# Patient Record
Sex: Female | Born: 1970
Health system: Southern US, Community
[De-identification: ages and names within clinical notes are randomized; demographics above are authoritative.]

## PROBLEM LIST (undated history)

## (undated) ENCOUNTER — Inpatient Hospital Stay (HOSPITAL_COMMUNITY): Payer: Self-pay

## (undated) DIAGNOSIS — F419 Anxiety disorder, unspecified: Secondary | ICD-10-CM

## (undated) DIAGNOSIS — F32A Depression, unspecified: Secondary | ICD-10-CM

## (undated) DIAGNOSIS — K760 Fatty (change of) liver, not elsewhere classified: Secondary | ICD-10-CM

## (undated) DIAGNOSIS — F329 Major depressive disorder, single episode, unspecified: Secondary | ICD-10-CM

## (undated) DIAGNOSIS — K529 Noninfective gastroenteritis and colitis, unspecified: Secondary | ICD-10-CM

## (undated) DIAGNOSIS — K8020 Calculus of gallbladder without cholecystitis without obstruction: Secondary | ICD-10-CM

## (undated) DIAGNOSIS — K589 Irritable bowel syndrome without diarrhea: Secondary | ICD-10-CM

## (undated) DIAGNOSIS — K5792 Diverticulitis of intestine, part unspecified, without perforation or abscess without bleeding: Secondary | ICD-10-CM

## (undated) DIAGNOSIS — K2970 Gastritis, unspecified, without bleeding: Secondary | ICD-10-CM

## (undated) DIAGNOSIS — K5790 Diverticulosis of intestine, part unspecified, without perforation or abscess without bleeding: Secondary | ICD-10-CM

## (undated) HISTORY — DX: Diverticulitis of intestine, part unspecified, without perforation or abscess without bleeding: K57.92

## (undated) HISTORY — PX: COLONOSCOPY: SHX174

## (undated) HISTORY — PX: NO PAST SURGERIES: SHX2092

## (undated) HISTORY — DX: Calculus of gallbladder without cholecystitis without obstruction: K80.20

## (undated) HISTORY — DX: Gastritis, unspecified, without bleeding: K29.70

## (undated) HISTORY — DX: Fatty (change of) liver, not elsewhere classified: K76.0

## (undated) HISTORY — DX: Irritable bowel syndrome, unspecified: K58.9

## (undated) HISTORY — DX: Diverticulosis of intestine, part unspecified, without perforation or abscess without bleeding: K57.90

---

## 2013-06-18 ENCOUNTER — Encounter (HOSPITAL_COMMUNITY): Payer: Self-pay | Admitting: Emergency Medicine

## 2013-06-18 ENCOUNTER — Emergency Department (HOSPITAL_COMMUNITY)
Admission: EM | Admit: 2013-06-18 | Discharge: 2013-06-18 | Disposition: A | Discharge status: Home / Self Care | Payer: Self-pay | Attending: Emergency Medicine | Admitting: Emergency Medicine

## 2013-06-18 ENCOUNTER — Emergency Department (HOSPITAL_COMMUNITY): Payer: Self-pay

## 2013-06-18 DIAGNOSIS — Z3202 Encounter for pregnancy test, result negative: Secondary | ICD-10-CM | POA: Insufficient documentation

## 2013-06-18 DIAGNOSIS — Z79899 Other long term (current) drug therapy: Secondary | ICD-10-CM | POA: Insufficient documentation

## 2013-06-18 DIAGNOSIS — K5289 Other specified noninfective gastroenteritis and colitis: Secondary | ICD-10-CM | POA: Insufficient documentation

## 2013-06-18 DIAGNOSIS — K529 Noninfective gastroenteritis and colitis, unspecified: Secondary | ICD-10-CM

## 2013-06-18 DIAGNOSIS — F411 Generalized anxiety disorder: Secondary | ICD-10-CM | POA: Insufficient documentation

## 2013-06-18 DIAGNOSIS — F3289 Other specified depressive episodes: Secondary | ICD-10-CM | POA: Insufficient documentation

## 2013-06-18 DIAGNOSIS — F329 Major depressive disorder, single episode, unspecified: Secondary | ICD-10-CM | POA: Insufficient documentation

## 2013-06-18 HISTORY — DX: Depression, unspecified: F32.A

## 2013-06-18 HISTORY — DX: Major depressive disorder, single episode, unspecified: F32.9

## 2013-06-18 HISTORY — DX: Anxiety disorder, unspecified: F41.9

## 2013-06-18 LAB — CBC WITH DIFFERENTIAL/PLATELET
Basophils Absolute: 0 10*3/uL (ref 0.0–0.1)
Basophils Relative: 0 % (ref 0–1)
Eosinophils Relative: 1 % (ref 0–5)
HCT: 39.8 % (ref 36.0–46.0)
Lymphocytes Relative: 40 % (ref 12–46)
MCHC: 35.2 g/dL (ref 30.0–36.0)
MCV: 86.7 fL (ref 78.0–100.0)
Monocytes Absolute: 0.8 10*3/uL (ref 0.1–1.0)
Neutro Abs: 4.1 10*3/uL (ref 1.7–7.7)
Platelets: 231 10*3/uL (ref 150–400)
RBC: 4.59 MIL/uL (ref 3.87–5.11)
RDW: 13.4 % (ref 11.5–15.5)
WBC: 8.3 10*3/uL (ref 4.0–10.5)

## 2013-06-18 LAB — URINALYSIS, ROUTINE W REFLEX MICROSCOPIC
Glucose, UA: NEGATIVE mg/dL
Ketones, ur: NEGATIVE mg/dL
Leukocytes, UA: NEGATIVE
Nitrite: NEGATIVE
Protein, ur: NEGATIVE mg/dL
Specific Gravity, Urine: 1.015 (ref 1.005–1.030)
Urobilinogen, UA: 0.2 mg/dL (ref 0.0–1.0)
pH: 6 (ref 5.0–8.0)

## 2013-06-18 LAB — POCT PREGNANCY, URINE: Preg Test, Ur: NEGATIVE

## 2013-06-18 LAB — COMPREHENSIVE METABOLIC PANEL
ALT: 26 U/L (ref 0–35)
AST: 23 U/L (ref 0–37)
Albumin: 4.4 g/dL (ref 3.5–5.2)
Calcium: 9.4 mg/dL (ref 8.4–10.5)
Creatinine, Ser: 0.65 mg/dL (ref 0.50–1.10)
Sodium: 139 mEq/L (ref 135–145)

## 2013-06-18 LAB — POCT I-STAT, CHEM 8
Calcium, Ion: 1.16 mmol/L (ref 1.12–1.23)
Chloride: 104 mEq/L (ref 96–112)
HCT: 43 % (ref 36.0–46.0)
Hemoglobin: 14.6 g/dL (ref 12.0–15.0)
Sodium: 142 mEq/L (ref 135–145)
TCO2: 21 mmol/L (ref 0–100)

## 2013-06-18 MED ORDER — POTASSIUM CHLORIDE 10 MEQ/100ML IV SOLN
10.0000 meq | Freq: Once | INTRAVENOUS | Status: AC
  Administered 2013-06-18: 10 meq via INTRAVENOUS
  Filled 2013-06-18: qty 100

## 2013-06-18 MED ORDER — SODIUM CHLORIDE 0.9 % IV SOLN
INTRAVENOUS | Status: DC
  Administered 2013-06-18: 06:00:00 via INTRAVENOUS

## 2013-06-18 MED ORDER — METRONIDAZOLE 500 MG PO TABS: 500.0000 mg | ORAL_TABLET | Freq: Two times a day (BID) | ORAL | Status: DC

## 2013-06-18 MED ORDER — CIPROFLOXACIN HCL 500 MG PO TABS: 500.0000 mg | ORAL_TABLET | Freq: Two times a day (BID) | ORAL | Status: DC

## 2013-06-18 MED ORDER — ONDANSETRON HCL 4 MG PO TABS: 4.0000 mg | ORAL_TABLET | Freq: Four times a day (QID) | ORAL | Status: DC

## 2013-06-18 MED ORDER — ONDANSETRON HCL 4 MG/2ML IJ SOLN
4.0000 mg | Freq: Once | INTRAMUSCULAR | Status: AC
  Administered 2013-06-18: 4 mg via INTRAVENOUS
  Filled 2013-06-18: qty 2

## 2013-06-18 MED ORDER — IOHEXOL 300 MG/ML  SOLN
50.0000 mL | Freq: Once | INTRAMUSCULAR | Status: AC | PRN
  Administered 2013-06-18: 25 mL via ORAL

## 2013-06-18 MED ORDER — POTASSIUM CHLORIDE CRYS ER 20 MEQ PO TBCR
40.0000 meq | EXTENDED_RELEASE_TABLET | Freq: Once | ORAL | Status: AC
  Administered 2013-06-18: 40 meq via ORAL
  Filled 2013-06-18: qty 2

## 2013-06-18 MED ORDER — HYDROCODONE-ACETAMINOPHEN 5-325 MG PO TABS: 2.0000 | ORAL_TABLET | Freq: Four times a day (QID) | ORAL | Status: DC | PRN

## 2013-06-18 MED ORDER — IOHEXOL 300 MG/ML  SOLN
100.0000 mL | Freq: Once | INTRAMUSCULAR | Status: AC | PRN
  Administered 2013-06-18: 100 mL via INTRAVENOUS

## 2013-06-18 MED ORDER — MORPHINE SULFATE 4 MG/ML IJ SOLN
4.0000 mg | Freq: Once | INTRAMUSCULAR | Status: AC
  Administered 2013-06-18: 4 mg via INTRAVENOUS
  Filled 2013-06-18: qty 1

## 2013-06-18 NOTE — ED Notes (Signed)
C/o bilateral lower abd pain, also nausea. (denies: vd fever, constipation, bleeding urinary or vaginal sx), sx onset ~ 2 weeks ago, comes and goes, last ate Tuesday breakfast, last BM Tuesday night. Alert, NAD, calm, interactive, steady gait.

## 2013-06-18 NOTE — ED Provider Notes (Signed)
CSN: [id number]     Arrival date & time 06/18/13  0508 History   First MD Initiated Contact with Patient 06/18/13 0602     Chief Complaint  Patient presents with  . Abdominal Pain   (Consider location/radiation/quality/duration/timing/severity/associated sxs/prior Treatment) HPI Comments: Patient presents to the ED with a chief complaint of abdominal pain.  Patient states that the pain began 5 days ago.  She states that the pain is mostly on the lower left side of her belly.  She endorses associated nausea and diarrhea.  No fevers, chills, vomiting, hematemesis, hematochezia, CP, or SOB.  Also denies dysuria and vaginal discharge.  She states that she has not tried taking anything to alleviate her symptoms.  She states that the pain is 9/10.  She has had this pain once before, approximately 2 years ago.  She states that she was treated with pain medications, and her symptoms subsided.  The history is provided by the patient. No language interpreter was used.    Past Medical History  Diagnosis Date  . Anxiety   . Depression    History reviewed. No pertinent past surgical history. No family history on file. History  Substance Use Topics  . Smoking status: Never Smoker   . Smokeless tobacco: Not on file  . Alcohol Use: No   OB History   Grav Para Term Preterm Abortions TAB SAB Ect Mult Living                 Review of Systems  All other systems reviewed and are negative.    Allergies  Review of patient's allergies indicates no known allergies.  Home Medications   Current Outpatient Rx  Name  Route  Sig  Dispense  Refill  . buPROPion (WELLBUTRIN XL) 150 MG 24 hr tablet   Oral   Take 150 mg by mouth daily.         . traZODone (DESYREL) 100 MG tablet   Oral   Take 50 mg by mouth at bedtime.          BP 145/90  Pulse 91  Temp(Src) 98.7 F (37.1 C) (Oral)  Resp 16  SpO2 98% Physical Exam  Nursing note and vitals reviewed. Constitutional: She is oriented to  person, place, and time. She appears well-developed and well-nourished.  HENT:  Head: Normocephalic and atraumatic.  Eyes: Conjunctivae and EOM are normal. Pupils are equal, round, and reactive to light.  Neck: Normal range of motion. Neck supple.  Cardiovascular: Normal rate and regular rhythm.  Exam reveals no gallop and no friction rub.   No murmur heard. Pulmonary/Chest: Effort normal and breath sounds normal. No respiratory distress. She has no wheezes. She has no rales. She exhibits no tenderness.  Abdominal: Soft. Bowel sounds are normal. She exhibits no distension and no mass. There is tenderness. There is no rebound and no guarding.  LLQ tenderness, no other focal abdominal tenderness, no fluid wave or signs of peritonitis, no pain at McBurney's point, no Murphy's sign  Musculoskeletal: Normal range of motion. She exhibits no edema and no tenderness.  Neurological: She is alert and oriented to person, place, and time.  Skin: Skin is warm and dry.  Psychiatric: She has a normal mood and affect. Her behavior is normal. Judgment and thought content normal.    ED Course  Procedures (including critical care time) Results for orders placed during the hospital encounter of 06/18/13  URINALYSIS, ROUTINE W REFLEX MICROSCOPIC      Result Value Range  Color, Urine YELLOW  YELLOW   APPearance CLOUDY (*) CLEAR   Specific Gravity, Urine 1.015  1.005 - 1.030   pH 6.0  5.0 - 8.0   Glucose, UA NEGATIVE  NEGATIVE mg/dL   Hgb urine dipstick NEGATIVE  NEGATIVE   Bilirubin Urine NEGATIVE  NEGATIVE   Ketones, ur NEGATIVE  NEGATIVE mg/dL   Protein, ur NEGATIVE  NEGATIVE mg/dL   Urobilinogen, UA 0.2  0.0 - 1.0 mg/dL   Nitrite NEGATIVE  NEGATIVE   Leukocytes, UA NEGATIVE  NEGATIVE  CBC WITH DIFFERENTIAL      Result Value Range   WBC 8.3  4.0 - 10.5 K/uL   RBC 4.59  3.87 - 5.11 MIL/uL   Hemoglobin 14.0  12.0 - 15.0 g/dL   HCT 16.1  09.6 - 04.5 %   MCV 86.7  78.0 - 100.0 fL   MCH 30.5  26.0 -  34.0 pg   MCHC 35.2  30.0 - 36.0 g/dL   RDW 40.9  81.1 - 91.4 %   Platelets 231  150 - 400 K/uL   Neutrophils Relative % 50  43 - 77 %   Neutro Abs 4.1  1.7 - 7.7 K/uL   Lymphocytes Relative 40  12 - 46 %   Lymphs Abs 3.3  0.7 - 4.0 K/uL   Monocytes Relative 10  3 - 12 %   Monocytes Absolute 0.8  0.1 - 1.0 K/uL   Eosinophils Relative 1  0 - 5 %   Eosinophils Absolute 0.1  0.0 - 0.7 K/uL   Basophils Relative 0  0 - 1 %   Basophils Absolute 0.0  0.0 - 0.1 K/uL  COMPREHENSIVE METABOLIC PANEL      Result Value Range   Sodium 139  135 - 145 mEq/L   Potassium 3.1 (*) 3.5 - 5.1 mEq/L   Chloride 102  96 - 112 mEq/L   CO2 23  19 - 32 mEq/L   Glucose, Bld 102 (*) 70 - 99 mg/dL   BUN 5 (*) 6 - 23 mg/dL   Creatinine, Ser 7.82  0.50 - 1.10 mg/dL   Calcium 9.4  8.4 - 95.6 mg/dL   Total Protein 7.9  6.0 - 8.3 g/dL   Albumin 4.4  3.5 - 5.2 g/dL   AST 23  0 - 37 U/L   ALT 26  0 - 35 U/L   Alkaline Phosphatase 57  39 - 117 U/L   Total Bilirubin 0.3  0.3 - 1.2 mg/dL   GFR calc non Af Amer >90  >90 mL/min   GFR calc Af Amer >90  >90 mL/min  LIPASE, BLOOD      Result Value Range   Lipase 18  11 - 59 U/L  POCT I-STAT, CHEM 8      Result Value Range   Sodium 142  135 - 145 mEq/L   Potassium 2.7 (*) 3.5 - 5.1 mEq/L   Chloride 104  96 - 112 mEq/L   BUN <3 (*) 6 - 23 mg/dL   Creatinine, Ser 2.13  0.50 - 1.10 mg/dL   Glucose, Bld 086 (*) 70 - 99 mg/dL   Calcium, Ion 5.78  4.69 - 1.23 mmol/L   TCO2 21  0 - 100 mmol/L   Hemoglobin 14.6  12.0 - 15.0 g/dL   HCT 62.9  52.8 - 41.3 %   Comment NOTIFIED PHYSICIAN    POCT PREGNANCY, URINE      Result Value Range   Preg Test, Ur  NEGATIVE  NEGATIVE   Ct Abdomen Pelvis W Contrast  06/18/2013   CLINICAL DATA:  Left lower quadrant abdominal pain and tenderness  EXAM: CT ABDOMEN AND PELVIS WITH CONTRAST  TECHNIQUE: Multidetector CT imaging of the abdomen and pelvis was performed using the standard protocol following bolus administration of intravenous  contrast.  CONTRAST:  OMNIPAQUE IOHEXOL 300 MG/ML  SOLN  COMPARISON:  None.  FINDINGS: Lung bases clear. Normal heart size. No pericardial or pleural effusion.  Abdomen: Liver demonstrates focal fatty infiltration along the falciform ligament anteriorly, image 21. No other hepatic abnormality. No biliary dilatation. Gallbladder, biliary system, pancreas, spleen, adrenal glands, and kidneys are within normal limits for age and demonstrate no acute process.  Negative for bowel obstruction, dilatation, ileus, or free air.  No abdominal free fluid, fluid collection, hemorrhage, abscess, or adenopathy.  Scattered diverticulosis of the colon, more pronounced on the left side. No definite CT evidence of diverticulitis. Normal appendix in the right lower quadrant.  Pelvis: Urinary bladder unremarkable. Dominant follicle on the left ovary measures 19 mm, image 59. Otherwise uterus and adnexal normal in size. No pelvic free fluid, fluid collection, hemorrhage, abscess, adenopathy, inguinal abnormality, or hernia.  No acute osseous finding.  IMPRESSION: No acute intra-abdominal or pelvic finding.  Focal fatty infiltration of the liver incidentally noted  Normal appendix  Colonic diverticulosis  19 mm right ovarian follicle   Electronically Signed   By: Ruel Favors M.D.   On: 06/18/2013 08:31      EKG Interpretation   None       MDM   1. Colitis     Patient with LLQ abdominal pain with diarrhea for the past 5 days.  Will give fluids, pain meds, and zofran.  Plan to assess abdomen with CT.  Patient is also quite hypokalemic.  Will supplement K.  CT is reassuring. CMP reveals low K., but this is improved. Will discharge to home with Cipro Flagyl. Will treat for colitis, this patient has had symptoms for the past 5 days, and does have some left lower quadrant tenderness. Return precautions are given, including high fever, or bloody stools. Patient understands and agrees to plan. She is stable and ready  for discharge.  Patient discussed with Dr. Wilkie Aye.    Roxy Horseman, PA-C 06/18/13 [phone]

## 2013-06-18 NOTE — ED Notes (Signed)
CT informed that pt is finished with contrast 

## 2013-06-20 NOTE — ED Provider Notes (Signed)
Medical screening examination/treatment/procedure(s) were performed by non-physician practitioner and as supervising physician I was immediately available for consultation/collaboration.  Sunnie Nielsen, MD 06/20/13 [phone]

## 2013-06-27 ENCOUNTER — Ambulatory Visit: Payer: Self-pay | Attending: Internal Medicine

## 2013-06-30 ENCOUNTER — Ambulatory Visit (HOSPITAL_BASED_OUTPATIENT_CLINIC_OR_DEPARTMENT_OTHER): Payer: No Typology Code available for payment source | Admitting: Internal Medicine

## 2013-06-30 ENCOUNTER — Ambulatory Visit: Payer: No Typology Code available for payment source | Attending: Internal Medicine

## 2013-06-30 ENCOUNTER — Encounter: Payer: Self-pay | Admitting: Internal Medicine

## 2013-06-30 VITALS — BP 154/112 | HR 82 | Temp 98.6°F | Resp 16 | Ht 63.0 in | Wt 168.2 lb

## 2013-06-30 DIAGNOSIS — R1032 Left lower quadrant pain: Secondary | ICD-10-CM | POA: Insufficient documentation

## 2013-06-30 DIAGNOSIS — R1013 Epigastric pain: Secondary | ICD-10-CM | POA: Insufficient documentation

## 2013-06-30 DIAGNOSIS — Z139 Encounter for screening, unspecified: Secondary | ICD-10-CM

## 2013-06-30 DIAGNOSIS — K3189 Other diseases of stomach and duodenum: Secondary | ICD-10-CM

## 2013-06-30 DIAGNOSIS — E876 Hypokalemia: Secondary | ICD-10-CM | POA: Insufficient documentation

## 2013-06-30 MED ORDER — OMEPRAZOLE 40 MG PO CPDR: 40.0000 mg | DELAYED_RELEASE_CAPSULE | Freq: Every day | ORAL | Status: DC

## 2013-06-30 NOTE — Progress Notes (Signed)
Patient was seen in the ED last week Diagnosis- colitis Went to ed for loss of appetite and weight loss And lower abd pain

## 2013-06-30 NOTE — Progress Notes (Signed)
Patient ID: Michele Vincent, female   DOB: [birth year], 42 y.o.   MRN: [id number] MRN: [id number] Name: Michele Vincent  Sex: female Age: 42 y.o. DOB: 1970-09-16  Allergies: Review of patient's allergies indicates no known allergies.   HPI: Patient is 42 y.o. female who was seen in the emergency room 2 weeks ago with symptoms of diarrhea abdominal pain, electronic medical record reviewed patient was diagnosed with colitis and was discharged on Cipro and Flagyl as per patient she has already completed a course of antibiotics currently she has some epigastric discomfort denies any nausea vomiting, denies any more diarrhea her last bowel movement was today in the morning which was semisolid.   Past Medical History  Diagnosis Date   Anxiety    Depression     History reviewed. No pertinent past surgical history.    Medication List       This list is accurate as of: 06/30/13  5:30 PM.  Always use your most recent med list.               buPROPion 150 MG 24 hr tablet  Commonly known as:  WELLBUTRIN XL  Take 150 mg by mouth daily.     ciprofloxacin 500 MG tablet  Commonly known as:  CIPRO  Take 1 tablet (500 mg total) by mouth every 12 (twelve) hours.     HYDROcodone-acetaminophen 5-325 MG per tablet  Commonly known as:  NORCO/VICODIN  Take 2 tablets by mouth every 6 (six) hours as needed.     metroNIDAZOLE 500 MG tablet  Commonly known as:  FLAGYL  Take 1 tablet (500 mg total) by mouth 2 (two) times daily.     omeprazole 40 MG capsule  Commonly known as:  PRILOSEC  Take 1 capsule (40 mg total) by mouth daily.     ondansetron 4 MG tablet  Commonly known as:  ZOFRAN  Take 1 tablet (4 mg total) by mouth every 6 (six) hours.     PARoxetine 20 MG tablet  Commonly known as:  PAXIL  Take 20 mg by mouth daily.     traZODone 100 MG tablet  Commonly known as:  DESYREL  Take 50 mg by mouth at bedtime.        Meds ordered this encounter  Medications   PARoxetine  (PAXIL) 20 MG tablet    Sig: Take 20 mg by mouth daily.   omeprazole (PRILOSEC) 40 MG capsule    Sig: Take 1 capsule (40 mg total) by mouth daily.    Dispense:  30 capsule    Refill:  3     There is no immunization history on file for this patient.  History  Substance Use Topics   Smoking status: Never Smoker    Smokeless tobacco: Not on file   Alcohol Use: No    Review of Systems  As noted in HPI  Filed Vitals:   06/30/13 1542  BP: 154/112  Pulse: 82  Temp: 98.6 F (37 C)  Resp: 16    Physical Exam  Physical Exam  Constitutional: No distress.  Eyes: EOM are normal. Pupils are equal, round, and reactive to light.  Cardiovascular: Normal rate and regular rhythm.   Pulmonary/Chest: She has no wheezes. She has no rales.  Abdominal:  Minimal epigastric tenderness , no rebound or guarding, BS+    CBC    Component Value Date/Time   WBC 8.3 06/18/2013 0540   RBC 4.59 06/18/2013 0540   HGB 14.6 06/18/2013 0541  HCT 43.0 06/18/2013 0541   PLT 231 06/18/2013 0540   MCV 86.7 06/18/2013 0540   LYMPHSABS 3.3 06/18/2013 0540   MONOABS 0.8 06/18/2013 0540   EOSABS 0.1 06/18/2013 0540   BASOSABS 0.0 06/18/2013 0540    CMP     Component Value Date/Time   NA 139 06/18/2013 0600   K 3.1* 06/18/2013 0600   CL 102 06/18/2013 0600   CO2 23 06/18/2013 0600   GLUCOSE 102* 06/18/2013 0600   BUN 5* 06/18/2013 0600   CREATININE 0.65 06/18/2013 0600   CALCIUM 9.4 06/18/2013 0600   PROT 7.9 06/18/2013 0600   ALBUMIN 4.4 06/18/2013 0600   AST 23 06/18/2013 0600   ALT 26 06/18/2013 0600   ALKPHOS 57 06/18/2013 0600   BILITOT 0.3 06/18/2013 0600   GFRNONAA >90 06/18/2013 0600   GFRAA >90 06/18/2013 0600    No results found for this basename: chol,  tri,  ldl    No components found with this basename: hga1c    Lab Results  Component Value Date/Time   AST 23 06/18/2013  6:00 AM    Assessment and Plan  Dyspepsia - Plan: Advised for lifestyle modification,  prescribed omeprazole (PRILOSEC) 40 MG capsule  Abdominal pain, left lower quadrant now improved.  Hypokalemia - Plan: Patient was given potassium  supplement while in the ER, will check blood  chemistries prior to the next visit also ordered baseline blood work.    Return in about 3 weeks (around 07/21/2013).  Doris Cheadle, MD

## 2013-07-21 ENCOUNTER — Ambulatory Visit: Payer: No Typology Code available for payment source | Attending: Internal Medicine

## 2013-07-21 DIAGNOSIS — Z139 Encounter for screening, unspecified: Secondary | ICD-10-CM

## 2013-07-21 DIAGNOSIS — E876 Hypokalemia: Secondary | ICD-10-CM

## 2013-07-21 LAB — COMPLETE METABOLIC PANEL WITH GFR
ALT: 16 U/L (ref 0–35)
Albumin: 4.5 g/dL (ref 3.5–5.2)
Alkaline Phosphatase: 62 U/L (ref 39–117)
BUN: 11 mg/dL (ref 6–23)
CO2: 25 mEq/L (ref 19–32)
Calcium: 9.7 mg/dL (ref 8.4–10.5)
Chloride: 107 mEq/L (ref 96–112)
Creat: 0.68 mg/dL (ref 0.50–1.10)
GFR, Est African American: >89 mL/min
Potassium: 4.1 mEq/L (ref 3.5–5.3)
Sodium: 141 mEq/L (ref 135–145)
Total Protein: 7.5 g/dL (ref 6.0–8.3)

## 2013-07-21 LAB — LIPID PANEL
Cholesterol: 214 mg/dL — ABNORMAL HIGH (ref 0–200)
HDL: 58 mg/dL (ref >39)
LDL Cholesterol: 133 mg/dL — ABNORMAL HIGH (ref 0–99)

## 2013-07-21 LAB — TSH: TSH: 1.277 u[IU]/mL (ref 0.350–4.500)

## 2013-07-22 ENCOUNTER — Telehealth: Payer: Self-pay | Admitting: *Deleted

## 2013-07-22 ENCOUNTER — Telehealth: Payer: Self-pay

## 2013-07-22 LAB — VITAMIN D 25 HYDROXY (VIT D DEFICIENCY, FRACTURES): Vit D, 25-Hydroxy: 29 ng/mL — ABNORMAL LOW (ref 30–89)

## 2013-07-22 NOTE — Telephone Encounter (Signed)
Used interpreter line Patient not available Message left on machine to return our call

## 2013-07-22 NOTE — Telephone Encounter (Signed)
Contacted pt to inform her of her lab results. Was sent to voicemail. Left a message for her to give [place] a call back. 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

## 2013-07-22 NOTE — Telephone Encounter (Signed)
Message copied by Lestine Mount on Tue Jul 22, 2013  4:25 PM ------      Message from: Doris Cheadle      Created: Tue Jul 22, 2013  1:49 PM       Blood work reviewed noticed impaired fasting glucose, call and advise patient for low carbohydrate diet.      Also  noticed elevated cholesterol, advise patient for low fat diet.             ------

## 2013-07-23 ENCOUNTER — Encounter: Payer: Self-pay | Admitting: Internal Medicine

## 2013-07-23 ENCOUNTER — Ambulatory Visit: Payer: No Typology Code available for payment source

## 2013-07-23 ENCOUNTER — Ambulatory Visit: Payer: No Typology Code available for payment source | Attending: Internal Medicine | Admitting: Internal Medicine

## 2013-07-23 VITALS — BP 140/90 | HR 108 | Temp 98.9°F | Resp 14 | Ht 65.0 in | Wt 172.0 lb

## 2013-07-23 DIAGNOSIS — Z792 Long term (current) use of antibiotics: Secondary | ICD-10-CM

## 2013-07-23 DIAGNOSIS — K5732 Diverticulitis of large intestine without perforation or abscess without bleeding: Secondary | ICD-10-CM | POA: Insufficient documentation

## 2013-07-23 LAB — BASIC METABOLIC PANEL
BUN: 4 mg/dL — ABNORMAL LOW (ref 6–23)
CO2: 23 mEq/L (ref 19–32)
Potassium: 3.8 mEq/L (ref 3.5–5.3)
Sodium: 138 mEq/L (ref 135–145)

## 2013-07-23 MED ORDER — TETANUS-DIPHTH-ACELL PERTUSSIS 5-2.5-18.5 LF-MCG/0.5 IM SUSP
0.5000 mL | Freq: Once | INTRAMUSCULAR | Status: AC
  Administered 2013-07-23: 0.5 mL via INTRAMUSCULAR

## 2013-07-23 NOTE — Patient Instructions (Signed)
Colitis  °(Colitis) °La colitis es la inflamación del colon. Puede ser una afección breve o de larga duración (crónica). La enfermedad de Crohn y la colitis ulcerosa son 2 tipos de colitis crónica. Requieren tratamiento permanente.  °CAUSAS  °Hay numerosas causas que originan este problema, entre las que se incluyen:  °· Virus. °· Gérmenes (bacterias). °· Reacciones a ciertos medicamentos. °SÍNTOMAS  °· Diarrea. °· Sangrado intestinal. °· Dolor. °· Fiebre. °· Devuelve la comida (vómitos). °· Cansancio (fatiga). °· Pérdida de peso. °· Obstrucción intestinal. °DIAGNÓSTICO  °El diagnóstico de colitis se basa en el examen y en análisis de materia fecal o de sangre. También podría ser necesario tomar radiografías, una tomografía computada y una colonoscopía.  °TRATAMIENTO  °El tratamiento puede incluir:  °· Administración de líquidos por la vena (vía intravenosa). °· Reposo del intestino (no comer ni beber nada durante cierto tiempo). °· Medicamentos para calmar el dolor y la diarrea. °· Medicamentos que destruyen gérmenes (antibióticos). °· Corticoides. °· Cirugía. °INSTRUCCIONES PARA EL CUIDADO EN EL HOGAR  °· Descanse lo suficiente. °· Beba gran cantidad de líquido para mantener la orina de tono claro o color amarillo pálido. °· Consuma una dieta bien balanceada. °· Comuníquese con su médico para realizar controles según las indicaciones. °SOLICITE ATENCIÓN MÉDICA DE INMEDIATO SI:  °· Comienza a sentir escalofríos. °· La temperatura oral le sube a más de 38,9° C (102° F), y no puede bajarla con medicamentos. °· Siente debilidad extrema, se desmaya o está deshidratado. °· Tiene vómitos persistentes. °· Siente mucho dolor en el vientre (abdomen) o elimina heces sanguinolentas o de aspecto alquitranado. °ASEGÚRESE DE QUE:  °· Comprende estas instrucciones. °· Controlará su enfermedad. °· Recibirá ayuda de inmediato si no mejora o si empeora. °Document Released: 07/24/2005 Document Revised: 03/26/2013 °[employer]® Patient  Information ©2014 [employer]. ° °

## 2013-07-23 NOTE — Progress Notes (Signed)
Pt is here for a f/u and medication review. Had blood work completed few days ago. Pt has family member to interpret.

## 2013-08-06 NOTE — Progress Notes (Signed)
Patient ID: Michele Vincent, female   DOB: 03/17/1971, 42 y.o.   MRN: [id number]   CC: Regular followup  HPI: Patient is 42 year old female who presents to clinic for followup. She reports feeling well and denies concerns at this time. She has been on ciprofloxacin and metronidazole for colitis. She explains that her symptoms are now resolved, denies diarrhea, no specific abdominal concerns. She is tolerating diet well  No Known Allergies Past Medical History  Diagnosis Date  . Anxiety   . Depression    Current Outpatient Prescriptions on File Prior to Visit  Medication Sig Dispense Refill  . omeprazole (PRILOSEC) 40 MG capsule Take 1 capsule (40 mg total) by mouth daily.  30 capsule  3  . PARoxetine (PAXIL) 20 MG tablet Take 20 mg by mouth daily.      Marland Kitchen buPROPion (WELLBUTRIN XL) 150 MG 24 hr tablet Take 150 mg by mouth daily.      . ciprofloxacin (CIPRO) 500 MG tablet Take 1 tablet (500 mg total) by mouth every 12 (twelve) hours.  20 tablet  0  . HYDROcodone-acetaminophen (NORCO/VICODIN) 5-325 MG per tablet Take 2 tablets by mouth every 6 (six) hours as needed.  15 tablet  0  . metroNIDAZOLE (FLAGYL) 500 MG tablet Take 1 tablet (500 mg total) by mouth 2 (two) times daily.  14 tablet  0  . ondansetron (ZOFRAN) 4 MG tablet Take 1 tablet (4 mg total) by mouth every 6 (six) hours.  12 tablet  0  . traZODone (DESYREL) 100 MG tablet Take 50 mg by mouth at bedtime.       No current facility-administered medications on file prior to visit.   No known family medical history History   Social History  . Marital Status: Single    Spouse Name: N/A    Number of Children: N/A  . Years of Education: N/A   Occupational History  . Not on file.   Social History Main Topics  . Smoking status: Never Smoker   . Smokeless tobacco: Not on file  . Alcohol Use: No  . Drug Use: No  . Sexual Activity: Not on file   Other Topics Concern  . Not on file   Social History Narrative  . No narrative  on file    Review of Systems  Constitutional: Negative for fever, chills, diaphoresis, activity change, appetite change and fatigue.  HENT: Negative for ear pain, nosebleeds, congestion, facial swelling, rhinorrhea, neck pain, neck stiffness and ear discharge.   Eyes: Negative for pain, discharge, redness, itching and visual disturbance.  Respiratory: Negative for cough, choking, chest tightness, shortness of breath, wheezing and stridor.   Cardiovascular: Negative for chest pain, palpitations and leg swelling.  Gastrointestinal: Negative for abdominal distention.  Genitourinary: Negative for dysuria, urgency, frequency, hematuria, flank pain, decreased urine volume, difficulty urinating and dyspareunia.  Musculoskeletal: Negative for back pain, joint swelling, arthralgias and gait problem.  Neurological: Negative for dizziness, tremors, seizures, syncope, facial asymmetry, speech difficulty, weakness, light-headedness, numbness and headaches.  Hematological: Negative for adenopathy. Does not bruise/bleed easily.  Psychiatric/Behavioral: Negative for hallucinations, behavioral problems, confusion, dysphoric mood, decreased concentration and agitation.    Objective:   Filed Vitals:   07/23/13 1651  BP: 140/90  Pulse: 108  Temp: 98.9 F (37.2 C)  Resp: 14    Physical Exam  Constitutional: Appears well-developed and well-nourished. No distress.  HENT: Normocephalic. External right and left ear normal. Oropharynx is clear and moist.  Eyes: Conjunctivae and EOM are  normal. PERRLA, no scleral icterus.  Neck: Normal ROM. Neck supple. No JVD. No tracheal deviation. No thyromegaly.  CVS: RRR, S1/S2 +, no murmurs, no gallops, no carotid bruit.  Pulmonary: Effort and breath sounds normal, no stridor, rhonchi, wheezes, rales.  Abdominal: Soft. BS +,  no distension, tenderness, rebound or guarding.  Musculoskeletal: Normal range of motion. No edema and no tenderness.  Lymphadenopathy: No  lymphadenopathy noted, cervical, inguinal. Neuro: Alert. Normal reflexes, muscle tone coordination. No cranial nerve deficit. Skin: Skin is warm and dry. No rash noted. Not diaphoretic. No erythema. No pallor.  Psychiatric: Normal mood and affect. Behavior, judgment, thought content normal.   Lab Results  Component Value Date   WBC 8.3 06/18/2013   HGB 14.6 06/18/2013   HCT 43.0 06/18/2013   MCV 86.7 06/18/2013   PLT 231 06/18/2013   Lab Results  Component Value Date   CREATININE 0.76 07/23/2013   BUN 4* 07/23/2013   NA 138 07/23/2013   K 3.8 07/23/2013   CL 103 07/23/2013   CO2 23 07/23/2013    No results found for this basename: HGBA1C   Lipid Panel     Component Value Date/Time   CHOL 214* 07/21/2013 0936   TRIG 115 07/21/2013 0936   HDL 58 07/21/2013 0936   CHOLHDL 3.7 07/21/2013 0936   VLDL 23 07/21/2013 0936   LDLCALC 133* 07/21/2013 0936       Assessment and plan:   Patient Active Problem List   Diagnosis Date Noted  . Dyspepsia 06/30/2013  . Abdominal pain, left lower quadrant 06/30/2013   - Recently diagnosed with diverticulitis and placed on ciprofloxacin and Flagyl, patient has several more days to finish his therapy with both antibiotics - Patient has been doing well and explains that her symptoms are now resolved, she is tolerating diet well

## 2013-08-26 ENCOUNTER — Ambulatory Visit: Payer: No Typology Code available for payment source | Admitting: Internal Medicine

## 2013-11-25 ENCOUNTER — Encounter (HOSPITAL_COMMUNITY): Payer: Self-pay | Admitting: Emergency Medicine

## 2013-11-25 ENCOUNTER — Emergency Department (INDEPENDENT_AMBULATORY_CARE_PROVIDER_SITE_OTHER)
Admission: EM | Admit: 2013-11-25 | Discharge: 2013-11-25 | Disposition: A | Discharge status: Home / Self Care | Payer: No Typology Code available for payment source | Source: Home / Self Care | Attending: Emergency Medicine | Admitting: Emergency Medicine

## 2013-11-25 DIAGNOSIS — K5289 Other specified noninfective gastroenteritis and colitis: Secondary | ICD-10-CM

## 2013-11-25 DIAGNOSIS — K529 Noninfective gastroenteritis and colitis, unspecified: Secondary | ICD-10-CM

## 2013-11-25 HISTORY — DX: Noninfective gastroenteritis and colitis, unspecified: K52.9

## 2013-11-25 LAB — POCT I-STAT, CHEM 8
BUN: 3 mg/dL — ABNORMAL LOW (ref 6–23)
Calcium, Ion: 1.27 mmol/L — ABNORMAL HIGH (ref 1.12–1.23)
Chloride: 103 mEq/L (ref 96–112)
Creatinine, Ser: 0.7 mg/dL (ref 0.50–1.10)
GLUCOSE: 116 mg/dL — AB (ref 70–99)
HCT: 49 % — ABNORMAL HIGH (ref 36.0–46.0)
HEMOGLOBIN: 16.7 g/dL — AB (ref 12.0–15.0)
POTASSIUM: 4.2 meq/L (ref 3.7–5.3)
Sodium: 141 mEq/L (ref 137–147)
TCO2: 25 mmol/L (ref 0–100)

## 2013-11-25 LAB — POCT H PYLORI SCREEN: H. PYLORI SCREEN, POC: NEGATIVE

## 2013-11-25 LAB — POCT URINALYSIS DIP (DEVICE)
Bilirubin Urine: NEGATIVE
Glucose, UA: NEGATIVE mg/dL
KETONES UR: NEGATIVE mg/dL
LEUKOCYTES UA: NEGATIVE
NITRITE: NEGATIVE
Protein, ur: NEGATIVE mg/dL
Specific Gravity, Urine: 1.01 (ref 1.005–1.030)
Urobilinogen, UA: 0.2 mg/dL (ref 0.0–1.0)
pH: 7 (ref 5.0–8.0)

## 2013-11-25 LAB — POCT PREGNANCY, URINE: Preg Test, Ur: NEGATIVE

## 2013-11-25 LAB — CBC WITH DIFFERENTIAL/PLATELET
BASOS PCT: 0 % (ref 0–1)
Basophils Absolute: 0 10*3/uL (ref 0.0–0.1)
EOS ABS: 0.1 10*3/uL (ref 0.0–0.7)
Eosinophils Relative: 1 % (ref 0–5)
HCT: 43.3 % (ref 36.0–46.0)
HEMOGLOBIN: 15.3 g/dL — AB (ref 12.0–15.0)
Lymphocytes Relative: 23 % (ref 12–46)
Lymphs Abs: 2.7 10*3/uL (ref 0.7–4.0)
MCH: 31.2 pg (ref 26.0–34.0)
MCHC: 35.3 g/dL (ref 30.0–36.0)
MCV: 88.4 fL (ref 78.0–100.0)
MONOS PCT: 5 % (ref 3–12)
Monocytes Absolute: 0.7 10*3/uL (ref 0.1–1.0)
Neutro Abs: 8.6 10*3/uL — ABNORMAL HIGH (ref 1.7–7.7)
Neutrophils Relative %: 71 % (ref 43–77)
Platelets: 229 10*3/uL (ref 150–400)
RBC: 4.9 MIL/uL (ref 3.87–5.11)
RDW: 12.8 % (ref 11.5–15.5)
WBC: 12 10*3/uL — ABNORMAL HIGH (ref 4.0–10.5)

## 2013-11-25 MED ORDER — CIPROFLOXACIN HCL 500 MG PO TABS: 500.0000 mg | ORAL_TABLET | Freq: Two times a day (BID) | ORAL | Status: DC

## 2013-11-25 MED ORDER — METRONIDAZOLE 500 MG PO TABS: 500.0000 mg | ORAL_TABLET | Freq: Two times a day (BID) | ORAL | Status: DC

## 2013-11-25 NOTE — Discharge Instructions (Signed)
Dieta para la diarrea - Adultos   (Diet for Diarrhea, Adult)   Las deposiciones acuosas frecuentes (diarrea) pueden ser originados o empeorar por algunos alimentos o bebidas. La diarrea puede mejorarse con un cambio en la dieta. Como la diarrea puede durar hasta 7 días, es fácil perder mucha cantidad de líquidos del organismo y deshidratarse. Los líquidos que se pierden deben reponerse. Junto con una dieta equilibrada y beber una gran cantidad de líquido para mantener la orina de tono claro o color amarillo pálido.  INSTRUCCIONES PARA LA DIETA   · Asegure una adecuada ingesta de líquidos (hidratación). Evite los líquidos que contengan azúcares simples o las bebidas deportivas, los jugos de frutas, los productos derivados de la leche entera y las gaseosas. Si bebe la cantidad suficiente de líquidos, la orina debe ser clara o amarillo pálido. Una solución de rehidratación oral se puede comprar en las farmacias, en las tiendas minoristas y por Internet. Se puede preparar una solución de rehidratación oral casera con los siguientes ingredientes:  ·    cucharadita de sal.  · ¾ cucharadita de bicarbonato.  ·  de cucharadita de sal sustituta (cloruro de potasio).  · 1  cucharada de azúcar.  · 1l (34 onzas) de agua.  · Ciertos alimentos y bebidas pueden aumentar la velocidad a la que el alimento progresa a través del tracto gastrointestinal (GI). Estos alimentos y bebidas deben evitarse e incluyen:  · Bebidas alcohólicas y con cafeína.  · Alimentos ricos en fibra, como frutas y verduras, nueces, semillas, panes y cereales integrales.  · Alimentos y bebidas endulzados con alcoholes de azúcar, tales como xilitol, sorbitol, y manitol.  · Algunos alimentos pueden ser bien tolerados y puede ayudar a espesar las heces, incluyendo:  · Alimentos con almidón, como arroz, pan, pasta, cereales bajos en azúcar, avena, sémola de maíz, papas al horno, galletas y panecillos.    · Bananas.    · Puré de manzana.  · Agregue alimentos ricos  en probióticos a la dieta del niño para ayudar a aumentar las bacterias saludables en el tracto gastrointestinal, como el yogur y productos lácteos fermentados.  ALIMENTOS Y BEBIDAS RECOMENDADOS   Féculas  Alimentos con menos de 2 g de fibra por porción.   · Recomendados:  Pan blanco, francés, pita, bollos y rosquillas. Muffins, pan ácimo. Galletas de agua, saladas o de graham. Pretzel, biscotes, bizcochos. Maíz refinado, trigo, arroz. Patatas preparadas de cualquier modo sin piel, macaroni, espaghetti, fideos, arroz refinado.  · Evite:  Pan, bollos o galletas preparadas con trigo entero, multigranos, salvado, semillas, frutos secos o coco. Tortillas de maíz o tacos. Cereales que contengan granos enteros, multigranos, salvado, coco, frutos secos o pasas de uva. Harina de avena cocida o seca. Cereales de grano grueso, granola. Cereales promocionados como con "alto contenido de fibra". Cáscara de patatas. Pasta integral, arroz marrón, arroz salvaje. Palomitas de maíz. Batatas. Panecillos dulces, donas, panqueques, waffles, pan dulce.  Vegetales  · Recomendados: Jugo de tomates o de vegetales. Vegetales bien cocidos o enlatados sin semillas. Frescos Lechuga tierna, pepino sin cáscara, repollos, espinacas, brotes de soja.  · Evite: [place], cocidos o enlatados: [place]ofas, porotos, remolacha, bróccoli, repollitos de [place], maíz, coles, legumbres, arvejas, batatas. Cocidos: Repollo verde o rojo, espinacas. Evite las porciones grandes de cualquier vegetal, debido a que los vegetales disminuyen su tamaño al cocinarlos y contienen más fibras por porción.  Frutas  · Recomendados: Cocidas o enlatadas: Duraznos, puré de manzanas, melón, cerezas, cóctel de frutas, pomelo, uvas, kiwi, naranjas, melocotón, pera, ciruelas,   sandías. Frescos: Manzanas sin la piel, banana madura, uvas, melón, cerezas, pomelo, duraznos, naranjas, ciruelas. Limite las porciones a ½ taza o1 unidad.  · Evite: [place]cos: Manzana con piel, damasco, mango,  pera, frambuesa, frutillas. Jugo de ciruela, compota o ciruelas secas. Frutas secas, pasas de uva, dátiles. Evite porciones grandes de todas las frutas frescas.  Proteínas  · Recomendados: Carne molida o un bife tierno bien cocido, jamón, ternera, cordero, cerdo o aves. Huevos. pescado, ostra, langostinos, langosta, frutos de mar. Hígado y otros órganos.  · Evite: Carnes duras y fibrosas con cartílago. Mantequilla de maní, suave o entera. Queso, frutos secos, semillas, legumbres, arvejas secas, lentejas.  Lácteos:  · Recomendados: Yogur, leche sin lactosa, kéfir, yogur bebible, suero de leche, leche de soja, o queso duro común.  · Evite: Leche, leche con chocolate, bebidas a base de leche, tales como batidos.  Sopas  · Recomendados: Consomé, caldo o sopas hechas con los alimentos permitidos. Cualquier sopa colada.  · Evite: Sopas hechas con vegetales no permitidos, sopas basadas en cremas o leche.  Postres y dulces  · Recomendados: Gelatina sin azúcar, helados de agua sin azúcar.  · Evite: Tortas y masitas, pasteles hechos con frutas permitidas, budines, natillas, pasteles con crema. Gelatina, frutas, hielo, sorbetes, helados de agua. Helados, batidos sin frutos secos. Caramelos duros, miel, gelatina, melaza, jarabes, azúcar, jarabe de chocolate, pastillas de goma, malvaviscos.  Grasas y aceites  · Recomendados: Limite las grasas a menos de 8 cucharaditas por día.  · Evite: Semillas, frutos secos, aceitunas, paltas. Margarina, manteca, crema, mayonesa, aceites para ensaladas, aderezos para ensaladas. Salsas, tocino sin corteza.  Bebidas  · Recomendados: Agua, tés descafeinados, soluciones de rehidratación oral, bebidas sin azúcar no endulzados con alcoholes de azúcar.  · Evite: Los jugos de frutas, bebidas con cafeína (café, té, refrescos), bebidas alcohólicas, bebidas deportivas, o gaseosa lima-limón.  Condimentos  · Recomendados: Ketchup, mostaza, rábano picante, el vinagre, el cacao en polvo. Especias con  moderación: pimienta de [place], alb[place]aca, laurel, polvo u hojas de apio, canela, comino en polvo, polvo de curry, jengibre, macis, mejorana, cebolla o ajo en polvo, orégano, pimentón, perejil, pimienta, romero, salvia, ajedrea, estragón, tomillo, cúrcuma.  · Evite: Coco, miel.  Document Released: 07/24/2005 Document Revised: 04/17/2012  [employer]® Patient Information ©2014 [employer].

## 2013-11-25 NOTE — ED Notes (Signed)
Stated history of colitis with 2 week duration of epigastric pain w some radiation to low t spine area. Denies vomiting, denies blood in stool. NAD at present

## 2013-11-25 NOTE — ED Provider Notes (Signed)
Chief Complaint   Chief Complaint  Patient presents with  . Abdominal Pain    History of Present Illness   Michele Vincent is a 43 year old female who does not speak English well and history was obtained with the help facility interpreter. The patient had an episode of what she describes as colitis this past November. She was prescribed Cipro and metronidazole. Her symptoms got better. [facility] She did well up until about 3 weeks ago when she again developed epigastric pain radiating through the back and to the lower abdomen, and loose stools without blood or mucus. She denies fever, chills, nausea, vomiting, anorexia, weight loss. She's had no urinary or GYN complaints.  Review of Systems   Other than as noted above, the patient denies any of the following symptoms: Constitutional:  No fever, chills, weight loss or anorexia. Abdomen:  No nausea, vomiting, hematememesis, melena, diarrhea, or hematochezia. GU:  No dysuria, frequency, urgency, or hematuria. Gyn:  No vaginal discharge, itching, abnormal bleeding, dyspareunia, or pelvic pain.  PMFSH   Past medical history, family history, social history, meds, and allergies were reviewed. She takes antidepressants and zolpidem for sleep.  Physical Exam     Vital signs:  BP 138/83  Pulse 81  Temp(Src) 98.7 F (37.1 C) (Oral)  Resp 18  SpO2 99% Gen:  Alert, oriented, in no distress. Lungs:  Breath sounds clear and equal bilaterally.  No wheezes, rales or rhonchi. Heart:  Regular rhythm.  No gallops or murmers.   Abdomen:  Soft, flat, and nondistended. No organomegaly or mass. Bowel sounds are hyperactive. She has tenderness to palpation in the epigastrium without guarding or rebound, no other tenderness to palpation. Skin:  Clear, warm and dry.  No rash.  Labs   Results for orders placed during the hospital encounter of 11/25/13  CBC WITH DIFFERENTIAL      Result Value Ref  Range   WBC 12.0 (*) 4.0 - 10.5 K/uL   RBC 4.90  3.87 - 5.11 MIL/uL   Hemoglobin 15.3 (*) 12.0 - 15.0 g/dL   HCT 16.143.3  09.636.0 - 04.546.0 %   MCV 88.4  78.0 - 100.0 fL   MCH 31.2  26.0 - 34.0 pg   MCHC 35.3  30.0 - 36.0 g/dL   RDW 40.912.8  81.111.5 - 91.415.5 %   Platelets 229  150 - 400 K/uL   Neutrophils Relative % 71  43 - 77 %   Neutro Abs 8.6 (*) 1.7 - 7.7 K/uL   Lymphocytes Relative 23  12 - 46 %   Lymphs Abs 2.7  0.7 - 4.0 K/uL   Monocytes Relative 5  3 - 12 %   Monocytes Absolute 0.7  0.1 - 1.0 K/uL   Eosinophils Relative 1  0 - 5 %   Eosinophils Absolute 0.1  0.0 - 0.7 K/uL   Basophils Relative 0  0 - 1 %   Basophils Absolute 0.0  0.0 - 0.1 K/uL  POCT I-STAT, CHEM 8      Result Value Ref Range   Sodium 141  137 - 147 mEq/L   Potassium 4.2  3.7 - 5.3 mEq/L   Chloride 103  96 - 112 mEq/L   BUN 3 (*) 6 - 23 mg/dL   Creatinine, Ser 7.820.70  0.50 - 1.10 mg/dL   Glucose, Bld 956116 (*) 70 - 99 mg/dL   Calcium, Ion 2.131.27 (*) 1.12 - 1.23 mmol/L   TCO2  25  0 - 100 mmol/L   Hemoglobin 16.7 (*) 12.0 - 15.0 g/dL   HCT 16.149.0 (*) 09.636.0 - 04.546.0 %  POCT URINALYSIS DIP (DEVICE)      Result Value Ref Range   Glucose, UA NEGATIVE  NEGATIVE mg/dL   Bilirubin Urine NEGATIVE  NEGATIVE   Ketones, ur NEGATIVE  NEGATIVE mg/dL   Specific Gravity, Urine 1.010  1.005 - 1.030   Hgb urine dipstick TRACE (*) NEGATIVE   pH 7.0  5.0 - 8.0   Protein, ur NEGATIVE  NEGATIVE mg/dL   Urobilinogen, UA 0.2  0.0 - 1.0 mg/dL   Nitrite NEGATIVE  NEGATIVE   Leukocytes, UA NEGATIVE  NEGATIVE  POCT H PYLORI SCREEN      Result Value Ref Range   H. PYLORI SCREEN, POC NEGATIVE  NEGATIVE  POCT PREGNANCY, URINE      Result Value Ref Range   Preg Test, Ur NEGATIVE  NEGATIVE    Assessment   The encounter diagnosis was Colitis.  She appears to have a recurrence of what she had back last November. This was diagnosed as "colitis". Since this has been a recurring issue she will need further evaluation, and I referred her to Dr. Loreta AveMann.  Since she has an orange card [facility]. In the meantime, since Cipro and metronidazole have helped in the past she was given another round of this.  Plan     1.  Meds:  The following meds were prescribed:   Discharge Medication List as of 11/25/2013 11:35 AM    START taking these medications   Details  !! ciprofloxacin (CIPRO) 500 MG tablet Take 1 tablet (500 mg total) by mouth every 12 (twelve) hours., Starting 11/25/2013, Until Discontinued, Normal    !! metroNIDAZOLE (FLAGYL) 500 MG tablet Take 1 tablet (500 mg total) by mouth 2 (two) times daily., Starting 11/25/2013, Until Discontinued, Normal     !! - Potential duplicate medications found. Please discuss with provider.      2.  Patient Education/Counseling:  The patient was given appropriate handouts, self care instructions, and instructed in symptomatic relief.    3.  Follow up:  The patient was told to follow up here if no better in 3 to 4 days, or sooner if becoming worse in any way, and given some red flag symptoms such as worsening pain, fever, vomiting, or evidence of GI bleeding which would prompt immediate return.  Follow up here as necessary.    Reuben Likesavid C Maeby Vankleeck, MD 11/25/13 [id number]

## 2014-01-01 ENCOUNTER — Encounter: Payer: Self-pay | Admitting: Internal Medicine

## 2014-01-01 ENCOUNTER — Ambulatory Visit: Payer: No Typology Code available for payment source | Attending: Internal Medicine | Admitting: Internal Medicine

## 2014-01-01 VITALS — BP 142/90 | HR 78 | Temp 99.0°F | Resp 16 | Wt 159.0 lb

## 2014-01-01 DIAGNOSIS — K5289 Other specified noninfective gastroenteritis and colitis: Secondary | ICD-10-CM | POA: Insufficient documentation

## 2014-01-01 DIAGNOSIS — F3289 Other specified depressive episodes: Secondary | ICD-10-CM | POA: Insufficient documentation

## 2014-01-01 DIAGNOSIS — K219 Gastro-esophageal reflux disease without esophagitis: Secondary | ICD-10-CM | POA: Insufficient documentation

## 2014-01-01 DIAGNOSIS — R1032 Left lower quadrant pain: Secondary | ICD-10-CM | POA: Insufficient documentation

## 2014-01-01 DIAGNOSIS — K529 Noninfective gastroenteritis and colitis, unspecified: Secondary | ICD-10-CM

## 2014-01-01 DIAGNOSIS — K5900 Constipation, unspecified: Secondary | ICD-10-CM | POA: Insufficient documentation

## 2014-01-01 DIAGNOSIS — F329 Major depressive disorder, single episode, unspecified: Secondary | ICD-10-CM | POA: Insufficient documentation

## 2014-01-01 DIAGNOSIS — Z79899 Other long term (current) drug therapy: Secondary | ICD-10-CM | POA: Insufficient documentation

## 2014-01-01 DIAGNOSIS — F411 Generalized anxiety disorder: Secondary | ICD-10-CM | POA: Insufficient documentation

## 2014-01-01 MED ORDER — MAGNESIUM HYDROXIDE 400 MG/5ML PO SUSP: 5.0000 mL | Freq: Every day | ORAL | Status: DC | PRN

## 2014-01-01 NOTE — Progress Notes (Signed)
MRN: [id number] Name: Michele Vincent  Sex: female Age: 43 y.o. DOB: 05-26-1971  Allergies: Review of patient's allergies indicates no known allergies.  Chief Complaint  Patient presents with  . Follow-up    HPI: Patient is 43 y.o. female who has to of GERD, colitis comes today for followup still has some abdominal pain which is more worse on left lower side, had been to the urgent care recently and had been treated for colitis with Cipro and Metro 2 times and patient was advised to follow with GI, patient also has history of anxiety/depression and is taking Paxil, following up with her psychiatrist. Patient reported to have recently more constipation.  Past Medical History  Diagnosis Date  . Anxiety   . Depression   . Colitis     History reviewed. No pertinent past surgical history.    Medication List       This list is accurate as of: 01/01/14 12:32 PM.  Always use your most recent med list.               buPROPion 150 MG 24 hr tablet  Commonly known as:  WELLBUTRIN XL  Take 150 mg by mouth daily.     ciprofloxacin 500 MG tablet  Commonly known as:  CIPRO  Take 1 tablet (500 mg total) by mouth every 12 (twelve) hours.     ciprofloxacin 500 MG tablet  Commonly known as:  CIPRO  Take 1 tablet (500 mg total) by mouth every 12 (twelve) hours.     HYDROcodone-acetaminophen 5-325 MG per tablet  Commonly known as:  NORCO/VICODIN  Take 2 tablets by mouth every 6 (six) hours as needed.     magnesium hydroxide 400 MG/5ML suspension  Commonly known as:  MILK OF MAGNESIA  Take 5 mLs by mouth daily as needed for mild constipation.     metroNIDAZOLE 500 MG tablet  Commonly known as:  FLAGYL  Take 1 tablet (500 mg total) by mouth 2 (two) times daily.     metroNIDAZOLE 500 MG tablet  Commonly known as:  FLAGYL  Take 1 tablet (500 mg total) by mouth 2 (two) times daily.     omeprazole 40 MG capsule  Commonly known as:  PRILOSEC  Take 1 capsule (40 mg total) by  mouth daily.     ondansetron 4 MG tablet  Commonly known as:  ZOFRAN  Take 1 tablet (4 mg total) by mouth every 6 (six) hours.     PARoxetine 20 MG tablet  Commonly known as:  PAXIL  Take 20 mg by mouth daily.     traZODone 100 MG tablet  Commonly known as:  DESYREL  Take 50 mg by mouth at bedtime.     zolpidem 10 MG tablet  Commonly known as:  AMBIEN  Take 10 mg by mouth at bedtime as needed for sleep.        Meds ordered this encounter  Medications  . magnesium hydroxide (MILK OF MAGNESIA) 400 MG/5ML suspension    Sig: Take 5 mLs by mouth daily as needed for mild constipation.    Dispense:  360 mL    Refill:  0    Immunization History  Administered Date(s) Administered  . Tdap 07/23/2013    History reviewed. No pertinent family history.  History  Substance Use Topics  . Smoking status: Never Smoker   . Smokeless tobacco: Not on file  . Alcohol Use: No    Review of Systems   As  noted in HPI  Filed Vitals:   01/01/14 1202  BP: 142/90  Pulse: 78  Temp: 99 F (37.2 C)  Resp: 16    Physical Exam  Physical Exam  Constitutional: No distress.  Eyes: EOM are normal. Pupils are equal, round, and reactive to light.  Cardiovascular: Normal rate and regular rhythm.   Pulmonary/Chest: Breath sounds normal. No respiratory distress. She has no wheezes. She has no rales.  Abdominal:  Lower abdomen some tenderness with deep palpation no rebound or guarding bowel sounds positive    CBC    Component Value Date/Time   WBC 12.0* 11/25/2013 1021   RBC 4.90 11/25/2013 1021   HGB 16.7* 11/25/2013 1041   HCT 49.0* 11/25/2013 1041   PLT 229 11/25/2013 1021   MCV 88.4 11/25/2013 1021   LYMPHSABS 2.7 11/25/2013 1021   MONOABS 0.7 11/25/2013 1021   EOSABS 0.1 11/25/2013 1021   BASOSABS 0.0 11/25/2013 1021    CMP     Component Value Date/Time   NA 141 11/25/2013 1041   K 4.2 11/25/2013 1041   CL 103 11/25/2013 1041   CO2 23 07/23/2013 1714   GLUCOSE 116* 11/25/2013 1041    BUN 3* 11/25/2013 1041   CREATININE 0.70 11/25/2013 1041   CREATININE 0.76 07/23/2013 1714   CALCIUM 9.6 07/23/2013 1714   PROT 7.5 07/21/2013 0936   ALBUMIN 4.5 07/21/2013 0936   AST 15 07/21/2013 0936   ALT 16 07/21/2013 0936   ALKPHOS 62 07/21/2013 0936   BILITOT 0.8 07/21/2013 0936   GFRNONAA >89 07/21/2013 0936   GFRNONAA >90 06/18/2013 0600   GFRAA >89 07/21/2013 0936   GFRAA >90 06/18/2013 0600    Lab Results  Component Value Date/Time   CHOL 214* 07/21/2013  9:36 AM    No components found with this basename: hga1c    Lab Results  Component Value Date/Time   AST 15 07/21/2013  9:36 AM    Assessment and Plan  Colitis - Plan: Patient has been treated for colitis 2 times, Ambulatory referral to Gastroenterology  Unspecified constipation - Plan: Trial of magnesium hydroxide (MILK OF MAGNESIA) 400 MG/5ML suspension  Abdominal pain, left lower quadrant - Plan: magnesium hydroxide (MILK OF MAGNESIA) 400 MG/5ML suspension, Ambulatory referral to Gastroenterology  GERD (gastroesophageal reflux disease)  continue with her Prilosec.    Return in about 3 months (around 04/03/2014) for gerd.  Doris Cheadleeepak Mitra Duling, MD

## 2014-01-01 NOTE — Progress Notes (Signed)
Patient here with interpreter Here for follow up on her stomach pain

## 2014-01-06 DIAGNOSIS — K5900 Constipation, unspecified: Secondary | ICD-10-CM | POA: Insufficient documentation

## 2014-01-06 DIAGNOSIS — K219 Gastro-esophageal reflux disease without esophagitis: Secondary | ICD-10-CM | POA: Insufficient documentation

## 2014-01-16 ENCOUNTER — Encounter: Payer: Self-pay | Admitting: Internal Medicine

## 2014-03-24 ENCOUNTER — Other Ambulatory Visit (INDEPENDENT_AMBULATORY_CARE_PROVIDER_SITE_OTHER): Payer: Self-pay

## 2014-03-24 ENCOUNTER — Ambulatory Visit (INDEPENDENT_AMBULATORY_CARE_PROVIDER_SITE_OTHER): Payer: Self-pay | Admitting: Internal Medicine

## 2014-03-24 ENCOUNTER — Encounter: Payer: Self-pay | Admitting: Internal Medicine

## 2014-03-24 ENCOUNTER — Telehealth: Payer: Self-pay | Admitting: Internal Medicine

## 2014-03-24 VITALS — BP 124/72 | HR 112 | Ht 65.0 in | Wt 151.0 lb

## 2014-03-24 DIAGNOSIS — R634 Abnormal weight loss: Secondary | ICD-10-CM

## 2014-03-24 DIAGNOSIS — K625 Hemorrhage of anus and rectum: Secondary | ICD-10-CM

## 2014-03-24 DIAGNOSIS — R1032 Left lower quadrant pain: Secondary | ICD-10-CM

## 2014-03-24 DIAGNOSIS — R1013 Epigastric pain: Secondary | ICD-10-CM

## 2014-03-24 DIAGNOSIS — R198 Other specified symptoms and signs involving the digestive system and abdomen: Secondary | ICD-10-CM

## 2014-03-24 LAB — CBC WITH DIFFERENTIAL/PLATELET
BASOS ABS: 0 10*3/uL (ref 0.0–0.1)
Basophils Relative: 0.3 % (ref 0.0–3.0)
Eosinophils Absolute: 0 10*3/uL (ref 0.0–0.7)
Eosinophils Relative: 0.2 % (ref 0.0–5.0)
HCT: 41.2 % (ref 36.0–46.0)
Hemoglobin: 14.1 g/dL (ref 12.0–15.0)
LYMPHS PCT: 17.9 % (ref 12.0–46.0)
Lymphs Abs: 1.9 10*3/uL (ref 0.7–4.0)
MCHC: 34.1 g/dL (ref 30.0–36.0)
MCV: 89.5 fl (ref 78.0–100.0)
MONO ABS: 0.7 10*3/uL (ref 0.1–1.0)
Monocytes Relative: 6.5 % (ref 3.0–12.0)
Neutro Abs: 7.8 10*3/uL — ABNORMAL HIGH (ref 1.4–7.7)
Neutrophils Relative %: 75.1 % (ref 43.0–77.0)
PLATELETS: 259 10*3/uL (ref 150.0–400.0)
RBC: 4.61 Mil/uL (ref 3.87–5.11)
RDW: 13.3 % (ref 11.5–15.5)
WBC: 10.4 10*3/uL (ref 4.0–10.5)

## 2014-03-24 LAB — COMPREHENSIVE METABOLIC PANEL
ALBUMIN: 4.5 g/dL (ref 3.5–5.2)
ALT: 22 U/L (ref 0–35)
AST: 17 U/L (ref 0–37)
Alkaline Phosphatase: 68 U/L (ref 39–117)
BUN: 6 mg/dL (ref 6–23)
CALCIUM: 9.7 mg/dL (ref 8.4–10.5)
CHLORIDE: 104 meq/L (ref 96–112)
CO2: 25 mEq/L (ref 19–32)
Creatinine, Ser: 0.7 mg/dL (ref 0.4–1.2)
GFR: 102.61 mL/min (ref >60.00)
Glucose, Bld: 108 mg/dL — ABNORMAL HIGH (ref 70–99)
Potassium: 3.4 mEq/L — ABNORMAL LOW (ref 3.5–5.1)
Sodium: 140 mEq/L (ref 135–145)
Total Bilirubin: 1.3 mg/dL — ABNORMAL HIGH (ref 0.2–1.2)
Total Protein: 8.1 g/dL (ref 6.0–8.3)

## 2014-03-24 LAB — H. PYLORI ANTIBODY, IGG: H PYLORI IGG: NEGATIVE

## 2014-03-24 LAB — TSH: TSH: 0.89 u[IU]/mL (ref 0.35–4.50)

## 2014-03-24 LAB — HIGH SENSITIVITY CRP: CRP, High Sensitivity: 2.65 mg/L (ref 0.000–5.000)

## 2014-03-24 LAB — IGA: IGA: 314 mg/dL (ref 68–378)

## 2014-03-24 MED ORDER — PANTOPRAZOLE SODIUM 40 MG PO TBEC: 40.0000 mg | DELAYED_RELEASE_TABLET | Freq: Every day | ORAL | Status: DC

## 2014-03-24 MED ORDER — HYOSCYAMINE SULFATE 0.125 MG SL SUBL: 0.1250 mg | SUBLINGUAL_TABLET | SUBLINGUAL | Status: DC | PRN

## 2014-03-24 MED ORDER — PEG-KCL-NACL-NASULF-NA ASC-C 100 G PO SOLR: 1.0000 | Freq: Once | ORAL | Status: DC

## 2014-03-24 NOTE — Progress Notes (Signed)
Patient ID: Michele Vincent, female   DOB: 09/15/1970, 43 y.o.   MRN: [id number] HPI: Michele Vincent is a 43 yo [place] female with past medical history of anxiety and depression who is seen in consultation at the request Dr. Orpah Cobb to evaluate history of "colitis". She is here alone today other than her Spanish interpreter. She reports several episodes of "colitis" over the last 8-10 months. The first happened in November 2014 when she was admitted for observation overnight. She recalls a CT scan and a diagnosis of colitis treated with ciprofloxacin and metronidazole. She reports initially this helped her left-sided abdominal pain and the medications were stopped in December 2014. Again symptoms recurred in March and April of 2015 and she was treated again with antibiotics about the same benefit. She recalls she was evaluated this time at [facility] and reportedly a repeat scan was done which did not show colitis.  Today she reports she has epigastric abdominal pain which is worse with eating. She denies heartburn, dysphagia or odynophagia. She has lower abdominal discomfort which can be bilateral though worse on the left. Her bowel habits are erratic in that she can have 2-3 soft and urgent bowel movements per day and other times feels constipated. One time in the past 2 weeks she's had red blood per rectum which she describes as a fairly small amount. She does note bloating and symptoms seem to worsen with stress. She denies fevers or chills. No mouth ulcers or rashes. She does report an approximate 50 pound weight loss in the last 4 months which she relates to dieting, but also secondary to her difficulty eating due to epigastric discomfort. She denies hepatobiliary complaint. Denies family history of IBD or GI tract malignancy.  Past Medical History  Diagnosis Date  . Anxiety   . Depression   . Colitis     History reviewed. No pertinent past surgical  history.  Outpatient Prescriptions Prior to Visit  Medication Sig Dispense Refill  . zolpidem (AMBIEN) 10 MG tablet Take 10 mg by mouth at bedtime as needed for sleep.      Marland Kitchen buPROPion (WELLBUTRIN XL) 150 MG 24 hr tablet Take 150 mg by mouth daily.      . ciprofloxacin (CIPRO) 500 MG tablet Take 1 tablet (500 mg total) by mouth every 12 (twelve) hours.  20 tablet  0  . ciprofloxacin (CIPRO) 500 MG tablet Take 1 tablet (500 mg total) by mouth every 12 (twelve) hours.  20 tablet  0  . HYDROcodone-acetaminophen (NORCO/VICODIN) 5-325 MG per tablet Take 2 tablets by mouth every 6 (six) hours as needed.  15 tablet  0  . magnesium hydroxide (MILK OF MAGNESIA) 400 MG/5ML suspension Take 5 mLs by mouth daily as needed for mild constipation.  360 mL  0  . metroNIDAZOLE (FLAGYL) 500 MG tablet Take 1 tablet (500 mg total) by mouth 2 (two) times daily.  14 tablet  0  . metroNIDAZOLE (FLAGYL) 500 MG tablet Take 1 tablet (500 mg total) by mouth 2 (two) times daily.  20 tablet  0  . omeprazole (PRILOSEC) 40 MG capsule Take 1 capsule (40 mg total) by mouth daily.  30 capsule  3  . ondansetron (ZOFRAN) 4 MG tablet Take 1 tablet (4 mg total) by mouth every 6 (six) hours.  12 tablet  0  . PARoxetine (PAXIL) 20 MG tablet Take 20 mg by mouth daily.      . traZODone (DESYREL) 100 MG tablet Take  50 mg by mouth at bedtime.       No facility-administered medications prior to visit.    No Known Allergies  Family History  Problem Relation Age of Onset  . Colon cancer Neg Hx     History  Substance Use Topics  . Smoking status: Never Smoker   . Smokeless tobacco: Never Used  . Alcohol Use: No    ROS: As per history of present illness, otherwise negative  BP 124/72  Pulse 112  Ht 5\' 5"  (1.651 m)  Wt 151 lb (68.493 kg)  BMI 25.13 kg/m2 Constitutional: Well-developed and well-nourished. No distress. HEENT: Normocephalic and atraumatic. Oropharynx is clear and moist. No oropharyngeal exudate. Conjunctivae  are normal.  No scleral icterus. Neck: Neck supple. Trachea midline. Cardiovascular: Normal rate, regular rhythm and intact distal pulses. No M/R/G Pulmonary/chest: Effort normal and breath sounds normal. No wheezing, rales or rhonchi. Abdominal: Soft, nontender, nondistended. Bowel sounds active throughout. There are no masses palpable. No hepatosplenomegaly. Extremities: no clubbing, cyanosis, or edema Lymphadenopathy: No cervical adenopathy noted. Neurological: Alert and oriented to person place and time. Skin: Skin is warm and dry. No rashes noted. Psychiatric: Normal mood and affect. Behavior is normal.  RELEVANT LABS AND IMAGING: CBC    Component Value Date/Time   WBC 12.0* 11/25/2013 1021   RBC 4.90 11/25/2013 1021   HGB 16.7* 11/25/2013 1041   HCT 49.0* 11/25/2013 1041   PLT 229 11/25/2013 1021   MCV 88.4 11/25/2013 1021   MCH 31.2 11/25/2013 1021   MCHC 35.3 11/25/2013 1021   RDW 12.8 11/25/2013 1021   LYMPHSABS 2.7 11/25/2013 1021   MONOABS 0.7 11/25/2013 1021   EOSABS 0.1 11/25/2013 1021   BASOSABS 0.0 11/25/2013 1021    CMP     Component Value Date/Time   NA 141 11/25/2013 1041   K 4.2 11/25/2013 1041   CL 103 11/25/2013 1041   CO2 23 07/23/2013 1714   GLUCOSE 116* 11/25/2013 1041   BUN 3* 11/25/2013 1041   CREATININE 0.70 11/25/2013 1041   CREATININE 0.76 07/23/2013 1714   CALCIUM 9.6 07/23/2013 1714   PROT 7.5 07/21/2013 0936   ALBUMIN 4.5 07/21/2013 0936   AST 15 07/21/2013 0936   ALT 16 07/21/2013 0936   ALKPHOS 62 07/21/2013 0936   BILITOT 0.8 07/21/2013 0936   GFRNONAA >89 07/21/2013 0936   GFRNONAA >90 06/18/2013 0600   GFRAA >89 07/21/2013 0936   GFRAA >90 06/18/2013 0600   CLINICAL DATA:  Left lower quadrant abdominal pain and tenderness   EXAM: CT ABDOMEN AND PELVIS WITH CONTRAST   TECHNIQUE: Multidetector CT imaging of the abdomen and pelvis was performed using the standard protocol following bolus administration of intravenous contrast.   CONTRAST:   OMNIPAQUE IOHEXOL 300 MG/ML  SOLN   COMPARISON:  None.   FINDINGS: Lung bases clear. Normal heart size. No pericardial or pleural effusion.   Abdomen: Liver demonstrates focal fatty infiltration along the falciform ligament anteriorly, image 21. No other hepatic abnormality. No biliary dilatation. Gallbladder, biliary system, pancreas, spleen, adrenal glands, and kidneys are within normal limits for age and demonstrate no acute process.   Negative for bowel obstruction, dilatation, ileus, or free air.   No abdominal free fluid, fluid collection, hemorrhage, abscess, or adenopathy.   Scattered diverticulosis of the colon, more pronounced on the left side. No definite CT evidence of diverticulitis. Normal appendix in the right lower quadrant.   Pelvis: Urinary bladder unremarkable. Dominant follicle on the left ovary measures  19 mm, image 59. Otherwise uterus and adnexal normal in size. No pelvic free fluid, fluid collection, hemorrhage, abscess, adenopathy, inguinal abnormality, or hernia.   No acute osseous finding.   IMPRESSION: No acute intra-abdominal or pelvic finding.   Focal fatty infiltration of the liver incidentally noted   Normal appendix   Colonic diverticulosis   19 mm right ovarian follicle     Electronically Signed   By: Ruel Favorsrevor  Shick M.D.   On: 06/18/2013 08:31     ASSESSMENT/PLAN:  43 yo [place] female with past medical history of anxiety and depression who is seen in consultation at the request Dr. Orpah CobbAdvani to evaluate history of "colitis".  1. Epigastric pain -- her pain seems most consistent with dyspepsia. We need to rule out H. pylori. Given her ongoing issues I recommended upper endoscopy. She did try PPI with omeprazole earlier in the year without much benefit. I would like to begin pantoprazole 40 mg daily, 30 minutes before breakfast. CMP, CBC, CRP, celiac panel, TSH, and H. pylori antibody today.  2. lower abdominal pain/left lower  quadrant pain/alternating bowel habits/rectal bleeding -- for these issues I recommended colonoscopy. We discussed upper and lower endoscopy including the risks and benefits and she is agreeable to proceed. Levsin will be prescribed to be used as directed and as needed for lower abdominal cramping pain. Labs as above. A large portion of her symptoms could be attributed to IBS, I would like to exclude inflammatory bowel disease. CT scan was reviewed there is no evidence of IBD but she does have left-sided predominant diverticulosis noted but no radiographic evidence of diverticulitis.

## 2014-03-24 NOTE — Patient Instructions (Signed)
Your physician has requested that you go to the basement for lab work before leaving today.  You have been scheduled for an endoscopy and colonoscopy. Please follow the written instructions given to you at your visit today. Please pick up your prep at the pharmacy within the next 1-3 days. If you use inhalers (even only as needed), please bring them with you on the day of your procedure. Your physician has requested that you go to www.startemmi.com and enter the access code given to you at your visit today. This web site gives a general overview about your procedure. However, you should still follow specific instructions given to you by our office regarding your preparation for the procedure.  We have sent the following medications to your pharmacy for you to pick up at your convenience:pantoprazole and Levsin.  cc: Doris Cheadleeepak Advani, MD

## 2014-03-24 NOTE — Telephone Encounter (Signed)
Called [facility] and gave Free Unit Voucher over the telephone and they state it ran through free of charge.

## 2014-03-25 LAB — TISSUE TRANSGLUTAMINASE, IGA: Tissue Transglutaminase Ab, IgA: 6.3 U/mL (ref <20)

## 2014-03-30 ENCOUNTER — Encounter: Payer: Self-pay | Admitting: Internal Medicine

## 2014-03-30 ENCOUNTER — Ambulatory Visit: Payer: Self-pay | Attending: Internal Medicine | Admitting: Internal Medicine

## 2014-03-30 VITALS — BP 137/87 | HR 97 | Temp 99.5°F | Resp 16 | Wt 153.2 lb

## 2014-03-30 DIAGNOSIS — Z139 Encounter for screening, unspecified: Secondary | ICD-10-CM

## 2014-03-30 DIAGNOSIS — K219 Gastro-esophageal reflux disease without esophagitis: Secondary | ICD-10-CM | POA: Insufficient documentation

## 2014-03-30 DIAGNOSIS — F411 Generalized anxiety disorder: Secondary | ICD-10-CM | POA: Insufficient documentation

## 2014-03-30 DIAGNOSIS — F329 Major depressive disorder, single episode, unspecified: Secondary | ICD-10-CM

## 2014-03-30 DIAGNOSIS — F3289 Other specified depressive episodes: Secondary | ICD-10-CM | POA: Insufficient documentation

## 2014-03-30 DIAGNOSIS — F419 Anxiety disorder, unspecified: Secondary | ICD-10-CM

## 2014-03-30 DIAGNOSIS — E876 Hypokalemia: Secondary | ICD-10-CM | POA: Insufficient documentation

## 2014-03-30 DIAGNOSIS — F341 Dysthymic disorder: Secondary | ICD-10-CM

## 2014-03-30 DIAGNOSIS — K5900 Constipation, unspecified: Secondary | ICD-10-CM | POA: Insufficient documentation

## 2014-03-30 DIAGNOSIS — R7301 Impaired fasting glucose: Secondary | ICD-10-CM | POA: Insufficient documentation

## 2014-03-30 DIAGNOSIS — K5289 Other specified noninfective gastroenteritis and colitis: Secondary | ICD-10-CM | POA: Insufficient documentation

## 2014-03-30 NOTE — Progress Notes (Signed)
Interpreter line used Patient states here for a follow up on her colitis And abdominal pain

## 2014-03-30 NOTE — Patient Instructions (Signed)
Dieta con alto contenido de fibra  (High [employer] Diet) La fibra se encuentra en frutas, verduras y granos. Una dieta con alto contenido en fibras se favorece con la adicin de ms granos enteros, legumbres, frutas y verduras en su dieta. La cantidad recomendada de fibra para los hombres adultos es de 38 g por da. Para las mujeres adultas es de 25 g por da. Las [employer] y las que amamantan deben consumir 27 gramos de fibra por Training and development officer. Si usted tiene un problema digestivo o intestinal, consulte a su mdico antes de la adicin de alimentos ricos en fibra a su dieta. Coma una variedad de alimentos ricos en fibra en lugar de slo unos pocos.  OBJETIVO   Aumentar la masa fecal.  Tener deposiciones ms regulares para evitar el estreimiento.  Reducir el colesterol.  Para evitar comer en exceso. [place]?   En caso de estreimiento y hemorroides.  En caso de diverticulosis no complicada (enfermedad intestinal) y en el sndrome del colon irritable.  Si necesita ayuda para el control de [place].  Si desea mejorar su dieta como medida de proteccin contra la aterosclerosis, la diabetes y Science writer. [place] y cereales integrales.  Frutas, como las [place], [place], pltanos, fresas, Development worker, community y peras.  Verduras, como guisantes, zanahorias, batatas, remolachas, brcoli, repollo, espinacas y alcauciles.  Legumbres, las arvejas, soja, lentejas.  Almendras. CONTENIDO DE FIBRA DE LOS ALIMENTOS  Almidones y granos / Heritage manager (g)   Cheerios, 1 taza / 3 g  Corn Flakes, 1 taza / 0,7 g  Arroz inflado, 1  tazas / 0,3 g  Harina de avena instantnea (cocida),  taza / 2 g  Cereal de trigo escarchado, 1 taza / 5,1 g  Arroz marrn grano largo (cocido), 1 taza / 3,5 g  Arroz blanco grano largo (cocido), 1 taza / 0,6 g  Macarrones enriquecidos (cocidos), 1 taza / 2,5 g Legumbres / Fibra Diettica (g)   Frijoles cocidos (enlatados, crudos o  vegetarianos),  taza / 5,2 g  Frijoles (enlatados),  taza / 6,8 g  Frijoles pintos (cocidos),  taza / 5,5 g Panes y Administrator / Heritage manager (g)   Galletas de graham o miel, 2 plazas / 0,7 g  Galletitas saladas, 3 unidades / 0,3 g  Pretzels salados comunes, 10 pedazos / 1,8 g  Pan integral, 1 rebanada / 1,9 g  Pan blanco, 1 rebanada / 0,7 g  Pan con pasas, 1 rebanada / 1,2 g  Bagel 3 oz / 2 g  Tortilla de harina, 1 oz / 0.9 g  Tortilla de maz, 1 pequea / 1,5 g  Pan de amburguesa o hot dog, 1 pequeo / 0,9 g Frutas / Fibra Diettica (g)   Manzana con piel, 1 mediana / 4,4 g  Pur de [employer],  taza / 1,5 g  [place],  mediano / 1,5 g  Uvas, 10 uvas / 0,4 g  Naranja, 1 pequea / 2,3 g  Pasas, 1,5 oz / 1.6 g  Meln, 1 taza / 1,4 g Vegetales / Fibra Diettica (g)   Judas verdes (en conserva),  taza / 1,3 g  Zanahorias (cocido),  taza / 2,3 g  Broccoli (cocido),  taza / 2,8 g  Guisantes (cocidos),  taza / 4,4 g  Pur de papas,  taza / 1,6 g  Lechuga, 1 taza / 0,5 g  Maz (en lata),  taza / 1,6 g  Tomate,  taza / 1,1 g  1 cup / 3 g. Document Released: 07/24/2005 Document Revised: 01/23/2012 [facility] Patient Information 2015 [place], [place]. This information is not intended to replace advice given to you by your health care provider. Make sure you discuss any questions you have with your health care provider.

## 2014-03-30 NOTE — Progress Notes (Signed)
MRN: [id number] Name: Michele Vincent  Sex: female Age: 43 y.o. DOB: [birth year]  Allergies: Review of patient's allergies indicates no known allergies.  Chief Complaint  Patient presents with  . Follow-up    HPI: Patient is 43 y.o. female who history of GERD, colitis, currently patient is following up with the GI and is taking Protonix and Levsin, he does complain of constipation denies any nausea vomiting, she also history of anxiety/depression and is taking Paxil following up with her psychiatrist. She recently had a blood work done which was reviewed had borderline low potassium level. Also noticed impaired fasting glucose, patient denies any family history of diabetes.  Past Medical History  Diagnosis Date  . Anxiety   . Depression   . Colitis     History reviewed. No pertinent past surgical history.    Medication List       This list is accurate as of: 03/30/14 11:39 AM.  Always use your most recent med list.               busPIRone 10 MG tablet  Commonly known as:  BUSPAR  Take 10 mg by mouth. 1/2 tablet twice daily     hyoscyamine 0.125 MG SL tablet  Commonly known as:  LEVSIN/SL  Place 1 tablet (0.125 mg total) under the tongue every 4 (four) hours as needed.     pantoprazole 40 MG tablet  Commonly known as:  PROTONIX  Take 1 tablet (40 mg total) by mouth daily.     peg 3350 powder 100 G Solr  Commonly known as:  MOVIPREP  Take 1 kit (200 g total) by mouth once.     zolpidem 10 MG tablet  Commonly known as:  AMBIEN  Take 10 mg by mouth at bedtime as needed for sleep.        No orders of the defined types were placed in this encounter.    Immunization History  Administered Date(s) Administered  . Tdap 07/23/2013    Family History  Problem Relation Age of Onset  . Colon cancer Neg Hx     History  Substance Use Topics  . Smoking status: Never Smoker   . Smokeless tobacco: Never Used  . Alcohol Use: No    Review of  Systems   As noted in HPI  Filed Vitals:   03/30/14 1114  BP: 137/87  Pulse: 97  Temp: 99.5 F (37.5 C)  Resp: 16    Physical Exam  Physical Exam  Constitutional: No distress.  Eyes: Pupils are equal, round, and reactive to light.  Cardiovascular: Normal rate and regular rhythm.   Pulmonary/Chest: Breath sounds normal. No respiratory distress. She has no wheezes. She has no rales.  Abdominal: Soft. There is no tenderness. There is no rebound.  Musculoskeletal: She exhibits no edema.    CBC    Component Value Date/Time   WBC 10.4 03/24/2014 1100   RBC 4.61 03/24/2014 1100   HGB 14.1 03/24/2014 1100   HCT 41.2 03/24/2014 1100   PLT 259.0 03/24/2014 1100   MCV 89.5 03/24/2014 1100   LYMPHSABS 1.9 03/24/2014 1100   MONOABS 0.7 03/24/2014 1100   EOSABS 0.0 03/24/2014 1100   BASOSABS 0.0 03/24/2014 1100    CMP     Component Value Date/Time   NA 140 03/24/2014 1100   K 3.4* 03/24/2014 1100   CL 104 03/24/2014 1100   CO2 25 03/24/2014 1100   GLUCOSE 108* 03/24/2014 1100   BUN 6  03/24/2014 1100   CREATININE 0.7 03/24/2014 1100   CREATININE 0.76 07/23/2013 1714   CALCIUM 9.7 03/24/2014 1100   PROT 8.1 03/24/2014 1100   ALBUMIN 4.5 03/24/2014 1100   AST 17 03/24/2014 1100   ALT 22 03/24/2014 1100   ALKPHOS 68 03/24/2014 1100   BILITOT 1.3* 03/24/2014 1100   GFRNONAA >89 07/21/2013 0936   GFRNONAA >90 06/18/2013 0600   GFRAA >89 07/21/2013 0936   GFRAA >90 06/18/2013 0600    Lab Results  Component Value Date/Time   CHOL 214* 07/21/2013  9:36 AM    No components found with this basename: hga1c    Lab Results  Component Value Date/Time   AST 17 03/24/2014 11:00 AM    Assessment and Plan  Gastroesophageal reflux disease, esophagitis presence not specified Continue with Protonix has some medication  Unspecified constipation Patient is advised for increase fiber diet,  Hypokalemia - Plan: Will repeat her Potassium level  IFG (impaired fasting glucose) - Plan: Advised  patient for low carbohydrate diet will check Hemoglobin A1c  Anxiety and depression Currently on Paxil following up with her psychiatrist.  Screening - Plan: MM DIGITAL SCREENING BILATERAL   Health Maintenance  -Mammogram: Ordered    Return in about 3 months (around 06/30/2014) for gerd.  Lorayne Marek, MD

## 2014-03-31 LAB — HEMOGLOBIN A1C
HEMOGLOBIN A1C: 5.5 % (ref <5.7)
Mean Plasma Glucose: 111 mg/dL (ref <117)

## 2014-03-31 LAB — POTASSIUM: Potassium: 4.3 mEq/L (ref 3.5–5.3)

## 2014-04-03 ENCOUNTER — Ambulatory Visit: Payer: Self-pay

## 2014-04-20 ENCOUNTER — Ambulatory Visit (AMBULATORY_SURGERY_CENTER): Payer: Self-pay | Admitting: Internal Medicine

## 2014-04-20 ENCOUNTER — Other Ambulatory Visit: Payer: Self-pay | Admitting: Internal Medicine

## 2014-04-20 ENCOUNTER — Encounter: Payer: Self-pay | Admitting: Internal Medicine

## 2014-04-20 VITALS — BP 144/94 | HR 100 | Temp 97.5°F | Resp 15 | Ht 65.0 in | Wt 151.0 lb

## 2014-04-20 DIAGNOSIS — R1032 Left lower quadrant pain: Secondary | ICD-10-CM

## 2014-04-20 DIAGNOSIS — K2970 Gastritis, unspecified, without bleeding: Secondary | ICD-10-CM

## 2014-04-20 DIAGNOSIS — K2990 Gastroduodenitis, unspecified, without bleeding: Secondary | ICD-10-CM

## 2014-04-20 DIAGNOSIS — K3189 Other diseases of stomach and duodenum: Secondary | ICD-10-CM

## 2014-04-20 DIAGNOSIS — R1013 Epigastric pain: Secondary | ICD-10-CM

## 2014-04-20 DIAGNOSIS — R198 Other specified symptoms and signs involving the digestive system and abdomen: Secondary | ICD-10-CM

## 2014-04-20 MED ORDER — CILIDINIUM-CHLORDIAZEPOXIDE 2.5-5 MG PO CAPS: ORAL_CAPSULE | ORAL | Status: DC

## 2014-04-20 MED ORDER — SODIUM CHLORIDE 0.9 % IV SOLN: 500.0000 mL | INTRAVENOUS | Status: DC

## 2014-04-20 NOTE — Op Note (Signed)
[facility] [address] [place], [address]   COLONOSCOPY PROCEDURE REPORT  PATIENT: Michele Vincent, Michele Vincent  MR#: [id number] BIRTHDATE: 08-18-1970 , 43  yrs. old GENDER: Female ENDOSCOPIST: Beverley Fiedler, MD REFERRED BY: Doris Cheadle, MD PROCEDURE DATE:  04/20/2014 PROCEDURE:   Colonoscopy, diagnostic First Screening Colonoscopy - Avg.  risk and is 50 yrs.  old or older - No.  Prior Negative Screening - Now for repeat screening. N/A  History of Adenoma - Now for follow-up colonoscopy & has been > or = to 3 yrs.  N/A  Polyps Removed Today? No.  Recommend repeat exam, <10 yrs? No. ASA CLASS:   Class II INDICATIONS:Abdominal pain and abdominal pain in the lower left quadrant. MEDICATIONS: MAC sedation, administered by CRNA and propofol (Diprivan)  IV  DESCRIPTION OF PROCEDURE:   After the risks benefits and alternatives of the procedure were thoroughly explained, informed consent was obtained.  A digital rectal exam revealed no rectal mass.   The LB [id number]  endoscope was introduced through the anus and advanced to the terminal ileum which was intubated for a short distance. No adverse events experienced.   The quality of the prep was good, using MoviPrep  The instrument was then slowly withdrawn as the colon was fully examined.   COLON FINDINGS: Normal terminal ileum in the examined segment. Moderate diverticulosis was noted in the ascending colon, transverse colon, descending colon, and sigmoid colon.   The colon mucosa was otherwise normal.  Retroflexed views revealed external hemorrhoids. The time to cecum=2 minutes 42 seconds.  Withdrawal time=9 minutes 02 seconds.  The scope was withdrawn and the procedure completed. COMPLICATIONS: There were no complications.  ENDOSCOPIC IMPRESSION: 1.   Normal terminal ileum 2.   Moderate diverticulosis was noted in the ascending colon, transverse colon, descending colon, and sigmoid colon 3.   The  colon mucosa was otherwise normal; no evidence of polyp, tumor or IBD  RECOMMENDATIONS: 1.  High fiber diet 2.  Levsin as directed and as needed for lower abdominal pain 3.  Office follow-up next available   eSigned:  Beverley Fiedler, MD 04/20/2014 4:20 PM   cc: The Patient; Doris Cheadle, MD

## 2014-04-20 NOTE — Progress Notes (Signed)
Report to PACU, RN, vss, BBS= Clear.  

## 2014-04-20 NOTE — Op Note (Signed)
[facility] [address] [place], [address]   ENDOSCOPY PROCEDURE REPORT  PATIENT: Michele, Vincent  MR#: [id number] BIRTHDATE: 11/15/1970 , 43  yrs. old GENDER: Female ENDOSCOPIST: Beverley Fiedler, MD REFERRED BY:  Doris Cheadle, MD PROCEDURE DATE:  04/20/2014 PROCEDURE:  EGD w/ biopsy for H.pylori ASA CLASS:     Class II INDICATIONS:  Epigastric pain. MEDICATIONS: MAC sedation, administered by CRNA and propofol (Diprivan)  IV TOPICAL ANESTHETIC: none  DESCRIPTION OF PROCEDURE: After the risks benefits and alternatives of the procedure were thoroughly explained, informed consent was obtained.  The LB [id number] endoscope was introduced through the mouth and advanced to the second portion of the duodenum. Without limitations.  The instrument was slowly withdrawn as the mucosa was fully examined.      The upper, middle and distal third of the esophagus were carefully inspected and no abnormalities were noted.  The z-line was well seen at the GEJ.  The endoscope was pushed into the fundus which was normal including a retroflexed view.  The antrum, gastric body, first and second part of the duodenum were unremarkable.  Biopsies were taken in the gastric body, antrum and angularis.  Retroflexed views revealed no abnormalities.     The scope was then withdrawn from the patient and the procedure completed.  COMPLICATIONS: There were no complications.  ENDOSCOPIC IMPRESSION: Normal EGD; biopsies were taken in the gastric body, antrum and angularis  RECOMMENDATIONS: 1.  Await biopsy results 2.  Proceed with a Colonoscopy.   eSigned:  Beverley Fiedler, MD 04/20/2014 4:16 PM   CC:The Patient; Doris Cheadle, MD

## 2014-04-20 NOTE — Progress Notes (Signed)
Called to room to assist during endoscopic procedure.  Patient ID and intended procedure confirmed with present staff. Received instructions for my participation in the procedure from the performing physician.  

## 2014-04-20 NOTE — Patient Instructions (Addendum)
YOU HAD AN ENDOSCOPIC PROCEDURE TODAY AT THE [facility]: Refer to the procedure report that was given to you for any specific questions about what was found during the examination.  If the procedure report does not answer your questions, please call your gastroenterologist to clarify.  If you requested that your care partner not be given the details of your procedure findings, then the procedure report has been included in a sealed envelope for you to review at your convenience later.  YOU SHOULD EXPECT: Some feelings of bloating in the abdomen. Passage of more gas than usual.  Walking can help get rid of the air that was put into your GI tract during the procedure and reduce the bloating. If you had a lower endoscopy (such as a colonoscopy or flexible sigmoidoscopy) you may notice spotting of blood in your stool or on the toilet paper. If you underwent a bowel prep for your procedure, then you may not have a normal bowel movement for a few days.  DIET: Your first meal following the procedure should be a light meal and then it is ok to progress to your normal diet.  A half-sandwich or bowl of soup is an example of a good first meal.  Heavy or fried foods are harder to digest and may make you feel nauseous or bloated.  Likewise meals heavy in dairy and vegetables can cause extra gas to form and this can also increase the bloating.  Drink plenty of fluids but you should avoid alcoholic beverages for 24 hours.  ACTIVITY: Your care partner should take you home directly after the procedure.  You should plan to take it easy, moving slowly for the rest of the day.  You can resume normal activity the day after the procedure however you should NOT DRIVE or use heavy machinery for 24 hours (because of the sedation medicines used during the test).    SYMPTOMS TO REPORT IMMEDIATELY: A gastroenterologist can be reached at any hour.  During normal business hours, 8:30 AM to 5:00 PM Monday through Friday,  call [phone].  After hours and on weekends, please call the GI answering service at [phone] who will take a message and have the physician on call contact you.   Following lower endoscopy (colonoscopy or flexible sigmoidoscopy):  Excessive amounts of blood in the stool  Significant tenderness or worsening of abdominal pains  Swelling of the abdomen that is new, acute  Fever of 100F or higher  Following upper endoscopy (EGD)  Vomiting of blood or coffee ground material  New chest pain or pain under the shoulder blades  Painful or persistently difficult swallowing  New shortness of breath  Fever of 100F or higher  Black, tarry-looking stools  FOLLOW UP: If any biopsies were taken you will be contacted by phone or by letter within the next 1-3 weeks.  Call your gastroenterologist if you have not heard about the biopsies in 3 weeks.  Our staff will call the home number listed on your records the next business day following your procedure to check on you and address any questions or concerns that you may have at that time regarding the information given to you following your procedure. This is a courtesy call and so if there is no answer at the home number and we have not heard from you through the emergency physician on call, we will assume that you have returned to your regular daily activities without incident.  SIGNATURES/CONFIDENTIALITY: You and/or your care   partner have signed paperwork which will be entered into your electronic medical record.  These signatures attest to the fact that that the information above on your After Visit Summary has been reviewed and is understood.  Full responsibility of the confidentiality of this discharge information lies with you and/or your care-partner.  Await pathology  Continue your medications except for your Levsin- your Librax prescription was sent to your pharmacy.  Take 1 tablet 2 to 3 times a day as needed for abdominal pain- do  not take if you are going to be driving  Please call office in the next few days to set up a follow up office appointment  Please read handouts about diverticulosis and high fiber diets

## 2014-04-21 ENCOUNTER — Telehealth: Payer: Self-pay | Admitting: *Deleted

## 2014-04-21 NOTE — Telephone Encounter (Signed)
  Follow up Call-  Call back number 04/20/2014  Post procedure Call Back phone  # [phone] spanish speaking ,no one @ this number will speak English therefore need spanidh speaking person to call to f/u  Permission to leave phone message Yes     Patient questions:  Do you have a fever, pain , or abdominal swelling? No. Pain Score  0 *  Have you tolerated food without any problems? Yes.    Have you been able to return to your normal activities? Yes.    Do you have any questions about your discharge instructions: Diet   No. Medications  No. Follow up visit  No.  Do you have questions or concerns about your Care? No.  Actions: * If pain score is 4 or above: No action needed, pain <4.

## 2014-04-28 ENCOUNTER — Encounter: Payer: Self-pay | Admitting: Internal Medicine

## 2014-06-12 ENCOUNTER — Ambulatory Visit: Payer: Self-pay | Admitting: Internal Medicine

## 2014-06-16 ENCOUNTER — Ambulatory Visit: Payer: Self-pay | Attending: Internal Medicine

## 2014-06-18 ENCOUNTER — Ambulatory Visit: Payer: Self-pay | Admitting: Internal Medicine

## 2014-07-09 ENCOUNTER — Ambulatory Visit (HOSPITAL_BASED_OUTPATIENT_CLINIC_OR_DEPARTMENT_OTHER): Payer: Self-pay

## 2014-07-09 ENCOUNTER — Ambulatory Visit: Payer: Self-pay | Attending: Internal Medicine | Admitting: Internal Medicine

## 2014-07-09 ENCOUNTER — Encounter: Payer: Self-pay | Admitting: Internal Medicine

## 2014-07-09 VITALS — BP 130/90 | HR 79 | Temp 98.0°F | Resp 16 | Wt 162.6 lb

## 2014-07-09 DIAGNOSIS — Z23 Encounter for immunization: Secondary | ICD-10-CM

## 2014-07-09 DIAGNOSIS — R1032 Left lower quadrant pain: Secondary | ICD-10-CM

## 2014-07-09 DIAGNOSIS — K2970 Gastritis, unspecified, without bleeding: Secondary | ICD-10-CM

## 2014-07-09 DIAGNOSIS — N926 Irregular menstruation, unspecified: Secondary | ICD-10-CM

## 2014-07-09 DIAGNOSIS — Z139 Encounter for screening, unspecified: Secondary | ICD-10-CM

## 2014-07-09 DIAGNOSIS — F329 Major depressive disorder, single episode, unspecified: Secondary | ICD-10-CM | POA: Insufficient documentation

## 2014-07-09 DIAGNOSIS — F419 Anxiety disorder, unspecified: Secondary | ICD-10-CM | POA: Insufficient documentation

## 2014-07-09 DIAGNOSIS — K5730 Diverticulosis of large intestine without perforation or abscess without bleeding: Secondary | ICD-10-CM | POA: Insufficient documentation

## 2014-07-09 LAB — POCT URINE PREGNANCY: Preg Test, Ur: POSITIVE

## 2014-07-09 MED ORDER — PANTOPRAZOLE SODIUM 40 MG PO TBEC: 40.0000 mg | DELAYED_RELEASE_TABLET | Freq: Every day | ORAL | Status: DC

## 2014-07-09 MED ORDER — CILIDINIUM-CHLORDIAZEPOXIDE 2.5-5 MG PO CAPS: ORAL_CAPSULE | ORAL | Status: DC

## 2014-07-09 NOTE — Progress Notes (Signed)
Patient here with interpreter Here for follow up  And medication refill

## 2014-07-09 NOTE — Progress Notes (Signed)
MRN: [id number] Name: Michele Vincent  Sex: female Age: 43 y.o. DOB: 06/26/1971  Allergies: Review of patient's allergies indicates no known allergies.  Chief Complaint  Patient presents with  . Follow-up    HPI: Patient is 43 y.o. female who has history anxiety/depression, gastritis, chronic left lower quadrant pain, patient was following up with the GI and had a colonoscopy and endoscopy done which reported diverticulosis and gastritis patient was prescribed Librax and Protonix, patient is requesting refill on his medications, she also follows up with the psychiatrist, patient has mentioned that she has missed her periods and would like to be checked if she is not pregnant.  Past Medical History  Diagnosis Date  . Anxiety   . Depression   . Colitis     History reviewed. No pertinent past surgical history.    Medication List       This list is accurate as of: 07/09/14  1:11 PM.  Always use your most recent med list.               busPIRone 10 MG tablet  Commonly known as:  BUSPAR  Take 10 mg by mouth. 1/2 tablet twice daily     clidinium-chlordiazePOXIDE 5-2.5 MG per capsule  Commonly known as:  LIBRAX  BID to TID daily as needed- do not take when you are going to be driving     pantoprazole 40 MG tablet  Commonly known as:  PROTONIX  Take 1 tablet (40 mg total) by mouth daily.     zolpidem 10 MG tablet  Commonly known as:  AMBIEN  Take 10 mg by mouth at bedtime as needed for sleep.        Meds ordered this encounter  Medications  . clidinium-chlordiazePOXIDE (LIBRAX) 5-2.5 MG per capsule    Sig: BID to TID daily as needed- do not take when you are going to be driving    Dispense:  60 capsule    Refill:  1  . pantoprazole (PROTONIX) 40 MG tablet    Sig: Take 1 tablet (40 mg total) by mouth daily.    Dispense:  30 tablet    Refill:  3    Immunization History  Administered Date(s) Administered  . Influenza,inj,Quad PF,36+ Mos 07/09/2014  .  Tdap 07/23/2013    Family History  Problem Relation Age of Onset  . Colon cancer Neg Hx   . CVA Mother   . Pneumonia Father     History  Substance Use Topics  . Smoking status: Never Smoker   . Smokeless tobacco: Never Used  . Alcohol Use: No    Review of Systems   As noted in HPI  Filed Vitals:   07/09/14 1210  BP: 130/90  Pulse: 79  Temp: 98 F (36.7 C)  Resp: 16    Physical Exam  Physical Exam  Constitutional: No distress.  Eyes: EOM are normal. Pupils are equal, round, and reactive to light.  Cardiovascular: Regular rhythm.   Pulmonary/Chest: Breath sounds normal. No respiratory distress. She has no wheezes. She has no rales.  Abdominal: Soft. There is no tenderness. There is no rebound.  Musculoskeletal: She exhibits no edema.    CBC    Component Value Date/Time   WBC 10.4 03/24/2014 1100   RBC 4.61 03/24/2014 1100   HGB 14.1 03/24/2014 1100   HCT 41.2 03/24/2014 1100   PLT 259.0 03/24/2014 1100   MCV 89.5 03/24/2014 1100   LYMPHSABS 1.9 03/24/2014 1100  MONOABS 0.7 03/24/2014 1100   EOSABS 0.0 03/24/2014 1100   BASOSABS 0.0 03/24/2014 1100    CMP     Component Value Date/Time   NA 140 03/24/2014 1100   K 4.3 03/30/2014 1142   CL 104 03/24/2014 1100   CO2 25 03/24/2014 1100   GLUCOSE 108* 03/24/2014 1100   BUN 6 03/24/2014 1100   CREATININE 0.7 03/24/2014 1100   CREATININE 0.76 07/23/2013 1714   CALCIUM 9.7 03/24/2014 1100   PROT 8.1 03/24/2014 1100   ALBUMIN 4.5 03/24/2014 1100   AST 17 03/24/2014 1100   ALT 22 03/24/2014 1100   ALKPHOS 68 03/24/2014 1100   BILITOT 1.3* 03/24/2014 1100   GFRNONAA >89 07/21/2013 0936   GFRNONAA >90 06/18/2013 0600   GFRAA >89 07/21/2013 0936   GFRAA >90 06/18/2013 0600    Lab Results  Component Value Date/Time   CHOL 214* 07/21/2013 09:36 AM    No components found for: HGA1C  Lab Results  Component Value Date/Time   AST 17 03/24/2014 11:00 AM    Assessment and Plan  Abdominal pain,  left lower quadrant - Plan: the patient was prescribed (LIBRAX) 5-2.5 MG per capsule by gastroenterologist I have discontinued today as her  urine pregnancy test came back positive  Patient will also check with her psychiatrist to optimize her medications as now she's pregnant.   Gastritis - Plan:continue with (PROTONIX) 40 MG tablet  Missed period - Plan: POCT urine pregnancy test number of positive she is given referral to GYN.   Health Maintenance -Colonoscopy:up-to-date with colonoscopy. -Pap Smear: referred to GYN -Mammogram: ordered  -Vaccinations:  Flu shot given today   Doris CheadleADVANI, Rozalyn Osland, MD

## 2014-07-21 ENCOUNTER — Telehealth: Payer: Self-pay

## 2014-07-21 DIAGNOSIS — Z3201 Encounter for pregnancy test, result positive: Secondary | ICD-10-CM

## 2014-07-21 NOTE — Telephone Encounter (Signed)
Dating and viability scan scheduled for Thursday 07/23/14 at 1:30. Patient informed of this appointment and schedule new OB in January via [place] and interpreter Adrianna.

## 2014-07-23 ENCOUNTER — Other Ambulatory Visit: Payer: Self-pay | Admitting: Obstetrics & Gynecology

## 2014-07-23 ENCOUNTER — Ambulatory Visit (HOSPITAL_COMMUNITY)
Admission: RE | Admit: 2014-07-23 | Discharge: 2014-07-23 | Disposition: A | Discharge status: Home / Self Care | Payer: Self-pay | Source: Ambulatory Visit | Attending: Obstetrics & Gynecology | Admitting: Obstetrics & Gynecology

## 2014-07-23 DIAGNOSIS — Z3201 Encounter for pregnancy test, result positive: Secondary | ICD-10-CM

## 2014-07-23 DIAGNOSIS — Z3A01 Less than 8 weeks gestation of pregnancy: Secondary | ICD-10-CM | POA: Insufficient documentation

## 2014-07-23 DIAGNOSIS — Z36 Encounter for antenatal screening of mother: Secondary | ICD-10-CM | POA: Insufficient documentation

## 2014-07-24 ENCOUNTER — Telehealth: Payer: Self-pay

## 2014-07-24 DIAGNOSIS — O3680X0 Pregnancy with inconclusive fetal viability, not applicable or unspecified: Secondary | ICD-10-CM

## 2014-07-24 NOTE — Telephone Encounter (Signed)
-----   Message from Tereso NewcomerUgonna A Anyanwu, MD sent at 07/24/2014  7:07 AM EST ----- Viability not proven yet, needs repeat ultrasound in 2 weeks. If viable with fetal pole and cardiac activity, will then schedule New OB appointment

## 2014-07-27 NOTE — Telephone Encounter (Signed)
US scheduled for December 31 @ 1130.  Beronica will call pt with US appt and NEW OB appt.

## 2014-08-06 ENCOUNTER — Encounter (HOSPITAL_COMMUNITY): Payer: Self-pay

## 2014-08-06 ENCOUNTER — Ambulatory Visit (HOSPITAL_COMMUNITY)
Admission: RE | Admit: 2014-08-06 | Discharge: 2014-08-06 | Disposition: A | Discharge status: Home / Self Care | Payer: Self-pay | Source: Ambulatory Visit | Attending: Obstetrics & Gynecology | Admitting: Obstetrics & Gynecology

## 2014-08-06 DIAGNOSIS — O3680X0 Pregnancy with inconclusive fetal viability, not applicable or unspecified: Secondary | ICD-10-CM

## 2014-08-06 DIAGNOSIS — Z3A01 Less than 8 weeks gestation of pregnancy: Secondary | ICD-10-CM | POA: Insufficient documentation

## 2014-08-06 DIAGNOSIS — Z36 Encounter for antenatal screening of mother: Secondary | ICD-10-CM | POA: Insufficient documentation

## 2014-08-10 ENCOUNTER — Telehealth: Payer: Self-pay

## 2014-08-10 DIAGNOSIS — O3680X0 Pregnancy with inconclusive fetal viability, not applicable or unspecified: Secondary | ICD-10-CM

## 2014-08-10 NOTE — Telephone Encounter (Signed)
[place] scheduled for January 11th 1130.  Called pt with Spanish interpreter Byrd Hesselbach and informed pt that we scheduled a follow up US from the one she had on 08/06/14.  I informed her that her appt is scheduled for 08/17/14 @ 1130.  Pt stated understanding.  I also notified patient of her NEW OB appt scheduled for 09/02/14 @ 1005.  Pt stated understanding.

## 2014-08-10 NOTE — Telephone Encounter (Signed)
-----   Message from Catalina Antigua, MD sent at 08/06/2014  2:52 PM EST ----- Please schedule repeat viability ultrasound in 7-10 days from 08/06/2014. Patient has a new ob appointment on 09/02/2013  Thanks  [employer]

## 2014-08-17 ENCOUNTER — Encounter (HOSPITAL_COMMUNITY): Payer: Self-pay | Admitting: *Deleted

## 2014-08-17 ENCOUNTER — Ambulatory Visit (HOSPITAL_COMMUNITY)
Admission: RE | Admit: 2014-08-17 | Discharge: 2014-08-17 | Disposition: A | Discharge status: Home / Self Care | Payer: Self-pay | Source: Ambulatory Visit | Attending: Obstetrics and Gynecology | Admitting: Obstetrics and Gynecology

## 2014-08-17 ENCOUNTER — Other Ambulatory Visit: Payer: Self-pay | Admitting: Obstetrics and Gynecology

## 2014-08-17 ENCOUNTER — Inpatient Hospital Stay (HOSPITAL_COMMUNITY)
Admission: AD | Admit: 2014-08-17 | Discharge: 2014-08-17 | Disposition: A | Discharge status: Home / Self Care | Payer: Self-pay | Source: Ambulatory Visit | Attending: Family Medicine | Admitting: Family Medicine

## 2014-08-17 DIAGNOSIS — O021 Missed abortion: Secondary | ICD-10-CM | POA: Insufficient documentation

## 2014-08-17 DIAGNOSIS — O3680X0 Pregnancy with inconclusive fetal viability, not applicable or unspecified: Secondary | ICD-10-CM

## 2014-08-17 DIAGNOSIS — N831 Corpus luteum cyst: Secondary | ICD-10-CM | POA: Insufficient documentation

## 2014-08-17 DIAGNOSIS — O074 Failed attempted termination of pregnancy without complication: Secondary | ICD-10-CM

## 2014-08-17 LAB — CBC
HCT: 37.4 % (ref 36.0–46.0)
HEMOGLOBIN: 13 g/dL (ref 12.0–15.0)
MCH: 30.7 pg (ref 26.0–34.0)
MCHC: 34.8 g/dL (ref 30.0–36.0)
MCV: 88.2 fL (ref 78.0–100.0)
Platelets: 212 10*3/uL (ref 150–400)
RBC: 4.24 MIL/uL (ref 3.87–5.11)
RDW: 13.3 % (ref 11.5–15.5)
WBC: 8.5 10*3/uL (ref 4.0–10.5)

## 2014-08-17 LAB — ABO/RH: ABO/RH(D): O POS

## 2014-08-17 MED ORDER — MISOPROSTOL 200 MCG PO TABS
600.0000 ug | ORAL_TABLET | Freq: Once | ORAL | Status: AC
  Administered 2014-08-17: 600 ug via ORAL
  Filled 2014-08-17: qty 3

## 2014-08-17 NOTE — Progress Notes (Signed)
I assisted Dr.Stinson with explanation of care plan. Eda H Royal  Interpreter. °

## 2014-08-17 NOTE — MAU Provider Note (Addendum)
History     CSN: [id number]  Arrival date and time: 08/17/14 1242   First Provider Initiated Contact with Patient 08/17/14 1410      Chief Complaint  Patient presents with  . Follow Up U/S    HPI Patient seen for failed pregnancy.  Denies cramping, bleeding, spotting.  OB History    Gravida Para Term Preterm AB TAB SAB Ectopic Multiple Living   Past Medical History  Diagnosis Date  . Anxiety   . Depression   . Colitis     No past surgical history on file.  Family History  Problem Relation Age of Onset  . Colon cancer Neg Hx   . CVA Mother   . Pneumonia Father     History  Substance Use Topics  . Smoking status: Never Smoker   . Smokeless tobacco: Never Used  . Alcohol Use: No    Allergies: No Known Allergies  Prescriptions prior to admission  Medication Sig Dispense Refill Last Dose  . busPIRone (BUSPAR) 10 MG tablet Take 10 mg by mouth. 1/2 tablet twice daily   04/20/2014  . pantoprazole (PROTONIX) 40 MG tablet Take 1 tablet (40 mg total) by mouth daily. 30 tablet 3   . zolpidem (AMBIEN) 10 MG tablet Take 10 mg by mouth at bedtime as needed for sleep.   04/19/2014    Review of Systems  Constitutional: Negative for fever and chills.  Gastrointestinal: Negative for nausea, vomiting, abdominal pain, diarrhea and constipation.  Genitourinary: Negative for frequency and hematuria.  Neurological: Negative for weakness.   Physical Exam   Blood pressure 149/79, pulse 88, temperature 98.8 F (37.1 C), temperature source Oral, resp. rate 16, height  (1.549 m), weight 168 lb 3.2 oz (76.295 kg), last menstrual period 04/13/2014, unknown if currently breastfeeding.  Physical Exam  Constitutional: She appears well-developed and well-nourished.  GI: Soft. She exhibits no distension and no mass. There is no tenderness. There is no rebound and no guarding.  Skin: Skin is warm and dry.  Psychiatric: She has a normal mood and affect. Her  behavior is normal. Judgment and thought content normal.    US Ob Transvaginal  08/17/2014   CLINICAL DATA:  Followup for probable missed AB. 9 weeks 6 days by LMP. 9 weeks 0 days by first ultrasound.  EXAM: TRANSVAGINAL OB ULTRASOUND  TECHNIQUE: Transvaginal ultrasound was performed for complete evaluation of the gestation as well as the maternal uterus, adnexal regions, and pelvic cul-de-sac.  COMPARISON:  08/06/2014 and 07/23/2014  FINDINGS: Intrauterine gestational sac: Present  Yolk sac:  Not seen  Embryo:  Not seen  MSD: 29.8  mm   8 w   2  d  Maternal uterus/adnexae: No subchorionic hemorrhage. The ovaries have a normal appearance. Right corpus luteum cyst is 1.6 x 1.5 x 1.6 cm. No free pelvic fluid.  IMPRESSION: Anembryonic gestation. Given the lack of embryo more than 2 weeks after a presence of gestational sac without yolk sac, the findings meet definitive criteria for failed pregnancy. This follows SRU consensus guidelines: Diagnostic Criteria for Nonviable Pregnancy Early in the First Trimester. [facility] [phone].   Electronically Signed   By: Rosalie Gums M.D.   On: 08/17/2014 12:26    MAU Course  Procedures cytotec given per protocol  MDM  Assessment and Plan  1.  Failed pregnancy Cytotec as below.  Discussed with patient and husband  regarding nature of failed pregnancy, what to expect with taking the cytotec.  All questions answered.  F/u in clinic 1 week   Early Intrauterine Pregnancy Failure  ___  Documented intrauterine pregnancy failure less than or equal to [redacted] weeks gestation  ___  No serious current illness  ___  Baseline Hgb greater than or equal to 10g/dl  ___  Patient has easily accessible transportation to the hospital  ___  Clear preference  ___  Practitioner/physician deems patient reliable  ___  Counseling by practitioner or physician  ___  Patient education by RN  ___  Consent form signed  ___  Rho-Gam given by RN if indicated  _x__  Medication dispensed   __x_   Cytotec 600 mcg  _x_  buccal  __x_  Ibuprofen 600 mg 1 tablet by mouth every 6 hours as needed #30  ___  Hydrocodone/acetaminophen 5/325 mg by mouth every 4 to 6 hours as needed  ___  Phenergan 12.5 mg by mouth every 4 hours as needed for nausea     Dequavius Kuhner JEHIEL 08/17/2014, 2:22 PM

## 2014-08-17 NOTE — Discharge Instructions (Signed)
Aborto incompleto °(Incomplete Miscarriage) °Un aborto espontáneo es la pérdida repentina de un bebé en gestación (feto) antes de la semana 20 del embarazo. En un aborto espontáneo, partes del feto o la placenta (alumbramiento) permanecen en el cuerpo.  °El aborto espontáneo puede ser una experiencia que afecte emocionalmente a la persona. Hable con su médico si tiene preguntas sobre el aborto espontáneo, el proceso de duelo y los planes futuros de embarazo. °CAUSAS  °· Algunos problemas cromosómicos pueden hacer imposible que el bebé se desarrolle normalmente. Los problemas con los genes o cromosomas del bebé son, en la mayoría de los casos, el resultado de errores que se producen, al azar, cuando el embrión se divide y crece. Estos problemas no se heredan de los padres. °· Infección en el cuello del útero. °· Problemas hormonales. °· Problemas en el cuello del útero, como tener un útero incompetente. Esto ocurre cuando los tejidos no son lo suficientemente fuertes como para contener el embarazo. °· Problemas del útero, como un útero con forma anormal, los fibromas o anormalidades congénitas. °· Ciertas enfermedades crónicas. °· No fume, no beba alcohol, ni consuma drogas. °· Traumatismos. °SÍNTOMAS  °· Sangrado o manchado vaginal, con o sin cólicos o dolor. °· Dolor o cólicos en el abdomen o en la cintura. °· Eliminación de líquido, tejidos o coágulos grandes por la vagina. °DIAGNÓSTICO  °El médico le hará un examen físico. También le indicará una ecografía para confirmar el aborto. Es posible que se realicen análisis de sangre. °TRATAMIENTO  °· Generalmente se realiza un procedimiento de dilatación y curetaje (D y C). Durante el procedimiento de dilatación y curetaje, el cuello del útero se abre (dilata) y se retira todo resto de tejido fetal o placentario del útero. °· Si hay una infección, le recetarán antibióticos. Posiblemente le receten otros medicamentos para reducir (contraer) el tamaño del útero si hay  mucha hemorragia. °· Si su tipo de sangre es Rh negativo y el del bebé es Rh positivo, necesitará una inyección de inmunoglobulina Rho(D). Esta inyección protegerá a los futuros bebés de tener problemas de compatibilidad Rh en futuros embarazos. °· Probablemente le indiquen reposo. Esto significa que debe quedarse en cama y levantarse únicamente para ir al baño. °INSTRUCCIONES PARA EL CUIDADO EN EL HOGAR  °· Haga reposo según las indicaciones del médico. °· Limite las actividades según las indicaciones del médico. Es posible que se le permita retomar las actividades livianas si no se le realizó un curetaje, pero necesitará tratamiento adicional. °· Lleve un registro de la cantidad de toallas sanitarias que usa por día. Observe cuán impregnadas (saturadas) están. Registre esta información. °· No  use tampones. °· No se haga duchas vaginales ni tenga relaciones sexuales hasta que el médico la autorice. °· Asista a todas las citas de seguimiento para una nueva evaluación y para continuar el tratamiento. °· Sólo tome medicamentos de venta libre o recetados para calmar el dolor, el malestar o bajar la fiebre, según las indicaciones de su médico. °· Tome los antibióticos como le indicó el médico. Asegúrese de que finaliza la prescripción completa aunque se sienta mejor. °SOLICITE ATENCIÓN MÉDICA DE INMEDIATO SI:  °· Siente calambres intensos en el estómago, en la espalda o en el abdomen. °· Le sube la fiebre sin motivo (asegúrese de registrar las cifras). °· Elimina coágulos grandes o tejidos (consérvelos para que el médico los analice). °· La hemorragia aumenta. °· Se siente mareada, débil o tiene episodios de desmayo. °ASEGÚRESE DE QUE:  °· Comprende estas instrucciones. °·   Controlará su afección. °· Recibirá ayuda de inmediato si no mejora o si empeora. °Document Released: 07/24/2005 Document Revised: 05/14/2013 °ExitCare® Patient Information ©2015 [employer]. This information is not intended to replace advice given  to you by your health care provider. Make sure you discuss any questions you have with your health care provider. ° °

## 2014-08-17 NOTE — MAU Note (Signed)
Cytotec consent signed. Albertina SenegalMarly Adams, interpreter present. All questions answered.

## 2014-08-18 ENCOUNTER — Ambulatory Visit: Payer: Self-pay | Admitting: Internal Medicine

## 2014-08-20 ENCOUNTER — Telehealth: Payer: Self-pay | Admitting: *Deleted

## 2014-08-20 ENCOUNTER — Other Ambulatory Visit: Payer: Self-pay | Admitting: Internal Medicine

## 2014-08-20 NOTE — Telephone Encounter (Signed)
Left voice message to return call 

## 2014-08-20 NOTE — Telephone Encounter (Signed)
Pt notified need F/U appointment

## 2014-08-20 NOTE — Telephone Encounter (Signed)
Pt pregnancy ended and she is wondering if she can continue, previously discontinued medication. Please follow up with pt.

## 2014-08-21 ENCOUNTER — Telehealth: Payer: Self-pay | Admitting: Internal Medicine

## 2014-08-25 ENCOUNTER — Encounter: Payer: Self-pay | Admitting: *Deleted

## 2014-08-28 ENCOUNTER — Ambulatory Visit: Payer: Self-pay | Admitting: Internal Medicine

## 2014-08-31 ENCOUNTER — Ambulatory Visit (INDEPENDENT_AMBULATORY_CARE_PROVIDER_SITE_OTHER): Payer: Self-pay | Admitting: Internal Medicine

## 2014-08-31 ENCOUNTER — Encounter: Payer: Self-pay | Admitting: Internal Medicine

## 2014-08-31 VITALS — BP 126/84 | HR 76 | Ht 60.75 in | Wt 167.2 lb

## 2014-08-31 DIAGNOSIS — K589 Irritable bowel syndrome without diarrhea: Secondary | ICD-10-CM

## 2014-08-31 DIAGNOSIS — R140 Abdominal distension (gaseous): Secondary | ICD-10-CM

## 2014-08-31 DIAGNOSIS — R1084 Generalized abdominal pain: Secondary | ICD-10-CM

## 2014-08-31 MED ORDER — METRONIDAZOLE 250 MG PO TABS: 250.0000 mg | ORAL_TABLET | Freq: Three times a day (TID) | ORAL | Status: DC

## 2014-08-31 NOTE — Patient Instructions (Addendum)
  We have sent the following medications to your pharmacy for you to pick up at your convenience: Flagyl   Please start Align after finishing Flagyl.  Please call to schedule a follow up appointment in 4-6 weeks.   CC: Dr Orpah CobbAdvani

## 2014-08-31 NOTE — Progress Notes (Signed)
Subjective:    Patient ID: Michele Vincent, female    DOB: 07/29/1971, 44 y.o.   MRN: [id number]  HPI Michele Vincent is a 44 yo female with PMH of abd pain with bloating, anxiety and depression who seen in follow-up. She is here alone but with a medical Spanish interpreter. She was initially seen in consultation in August 2015 to evaluate epigastric pain, lower abdominal pain and alternating bowel habits with rectal bleeding. She had an upper endoscopy and colonoscopy performed on 04/20/2014. Upper endoscopy was normal, gastric biopsies showed mild chronic gastritis but no H. pylori, dysplasia or malignancy. Colonoscopy to the terminal ileum was normal other than moderate diverticulosis throughout the colon from ascending to sigmoid. She was given a trial of Levsin and when this did not help Librax.  Unfortunately several weeks ago she had a failed pregnancy. She wasn't actively trying to get pregnant but the pregnancy ended in spontaneous abortion.  She reports she continues to have stomach and abdominal bloating worse with food. She can have pain which can be located in different places including epigastric left abdomen lower abdomen at times even right abdomen. Again this is worse with eating. She has tried to avoid high fat foods, carbonated beverages but it hasn't helped much. She was started on pantoprazole 40 mg daily in December 2015 by primary care but has noted no benefit. Bowel movements alternate between diarrhea and constipation. Pain seems to be a little better after bowel movement. She does note nausea but no vomiting. No trouble swallowing. No rectal bleeding or melena. She is off most of her medicines but is taking pantoprazole daily. She is no longer taking buspirone or proximal at and.  Review of Systems As per history of present illness, otherwise negative  Current Medications, Allergies, Past Medical History, Past Surgical History, Family History and Social History were  reviewed in [employer] record.     Objective:   Physical Exam BP 126/84 mmHg  Pulse 76  Ht 5' 0.75" (1.543 m)  Wt 167 lb 4 oz (75.864 kg)  BMI 31.86 kg/m2  LMP 06/09/2014  Breastfeeding? Unknown Constitutional: Well-developed and well-nourished. No distress. HEENT: Normocephalic and atraumatic. Oropharynx is clear and moist. No oropharyngeal exudate. Conjunctivae are normal.  No scleral icterus. Neck: Neck supple. Trachea midline. Cardiovascular: Normal rate, regular rhythm and intact distal pulses. No M/R/G Pulmonary/chest: Effort normal and breath sounds normal. No wheezing, rales or rhonchi. Abdominal: Soft, diffuse tenderness without rebound or guarding, nondistended. Bowel sounds active throughout. There are no masses palpable. No hepatosplenomegaly. Extremities: no clubbing, cyanosis, or edema Lymphadenopathy: No cervical adenopathy noted. Neurological: Alert and oriented to person place and time. Skin: Skin is warm and dry. No rashes noted. Psychiatric: Normal mood and affect. Behavior is normal.  Lab Results  Component Value Date   TSH 0.89 03/24/2014   Celiac panel negative  CMP     Component Value Date/Time   NA 140 03/24/2014 1100   K 4.3 03/30/2014 1142   CL 104 03/24/2014 1100   CO2 25 03/24/2014 1100   GLUCOSE 108* 03/24/2014 1100   BUN 6 03/24/2014 1100   CREATININE 0.7 03/24/2014 1100   CREATININE 0.76 07/23/2013 1714   CALCIUM 9.7 03/24/2014 1100   PROT 8.1 03/24/2014 1100   ALBUMIN 4.5 03/24/2014 1100   AST 17 03/24/2014 1100   ALT 22 03/24/2014 1100   ALKPHOS 68 03/24/2014 1100   BILITOT 1.3* 03/24/2014 1100   GFRNONAA >89 07/21/2013 8657  GFRNONAA >90 06/18/2013 0600   GFRAA >89 07/21/2013 0936   GFRAA >90 06/18/2013 0600    CBC    Component Value Date/Time   WBC 8.5 08/17/2014 1429   RBC 4.24 08/17/2014 1429   HGB 13.0 08/17/2014 1429   HCT 37.4 08/17/2014 1429   PLT 212 08/17/2014 1429   MCV 88.2 08/17/2014  1429   MCH 30.7 08/17/2014 1429   MCHC 34.8 08/17/2014 1429   RDW 13.3 08/17/2014 1429   LYMPHSABS 1.9 03/24/2014 1100   MONOABS 0.7 03/24/2014 1100   EOSABS 0.0 03/24/2014 1100   BASOSABS 0.0 03/24/2014 1100   EGD and colonoscopy reviewed with patient including pathology report    Assessment & Plan:   44 yo female with PMH of abd pain with bloating, anxiety and depression who seen in follow-up.  1. IBS (diffuse intermittent abdominal pain, gas and bloating) -- her symptoms are most consistent with irritable bowel after laboratory and endoscopic evaluation. I tried to encourage her that we have found nothing to suggest inflammatory bowel disease or occult malignancy. She worries about having "cancer".  I have tried to reassure her today and we spent a great deal of time discussing IBS and functional abdominal disorders. Ideally I would give her a trial of rifaximin but she cannot afford this medication. Pantoprazole does not seem to be helping and so I have recommended discontinuing it. Trial of Flagyl 250 mg 3 times daily for 14 days for irritable bowel and possible bacterial overgrowth. Begin align as a probiotic daily once completing antibiotic. Will not refill Librax as it was not helpful either. Return in 4-6 weeks with me or advanced practitioner.

## 2014-09-02 ENCOUNTER — Encounter: Payer: Self-pay | Admitting: Obstetrics and Gynecology

## 2014-09-03 ENCOUNTER — Ambulatory Visit: Payer: Self-pay | Admitting: Internal Medicine

## 2014-09-04 ENCOUNTER — Ambulatory Visit (INDEPENDENT_AMBULATORY_CARE_PROVIDER_SITE_OTHER): Payer: Self-pay | Admitting: Nurse Practitioner

## 2014-09-04 ENCOUNTER — Encounter: Payer: Self-pay | Admitting: Nurse Practitioner

## 2014-09-04 VITALS — BP 138/89 | HR 90 | Temp 99.0°F | Ht 60.0 in | Wt 166.3 lb

## 2014-09-04 DIAGNOSIS — F329 Major depressive disorder, single episode, unspecified: Secondary | ICD-10-CM

## 2014-09-04 DIAGNOSIS — F419 Anxiety disorder, unspecified: Secondary | ICD-10-CM

## 2014-09-04 DIAGNOSIS — F32A Depression, unspecified: Secondary | ICD-10-CM

## 2014-09-04 DIAGNOSIS — O039 Complete or unspecified spontaneous abortion without complication: Secondary | ICD-10-CM

## 2014-09-04 DIAGNOSIS — E669 Obesity, unspecified: Secondary | ICD-10-CM

## 2014-09-04 NOTE — Progress Notes (Signed)
History:  Michele Vincent is a 44 y.o. G2P1001 who presents to [facility] clinic today for follow up on SAB from Aug 17, 2014. This was an unplanned pregnancy, her last child is 62 years old. She has new sexual part and plans to become pregnant again. She is still having some low back pain and urinary frequency. She has been spotting up until 3 days ago. She declines contraception and will use condoms for next 3 months. She did not have past quant. Hx of anxiety, depression and is on Paxil and Buspar. She is aware Paxil is not considered safe in pregnancy. Spanish Interpreter used.   The following portions of the patient's history were reviewed and updated as appropriate: allergies, current medications, past family history, past medical history, past social history, past surgical history and problem list.  Review of Systems:    Objective:  Physical Exam BP 138/89 mmHg  Pulse 90  Temp(Src) 99 F (37.2 C)  Ht 5' (1.524 m)  Wt 166 lb 4.8 oz (75.433 kg)  BMI 32.48 kg/m2  LMP 06/09/2014 GENERAL: Well-developed, well-nourished female in no acute distress. Obese HEENT: Normocephalic, atraumatic.  NECK: Supple. Normal thyroid. ABDOMEN: Soft, nontender, nondistended. No organomegaly. Normal bowel sounds appreciated in all quadrants.  PELVIC: Normal external female genitalia. Vagina is pink and rugated.  Normal discharge. Normal cervix contour. Marland Kitchen Uterus is normal in size. No adnexal mass or tenderness.  EXTREMITIES: No cyanosis, clubbing, or edema, 2+ distal pulses.  No results found for this or any previous visit (from the past 24 hour(s)). Urine was not obtained today  Labs and Imaging US Ob Transvaginal  08/17/2014   CLINICAL DATA:  Followup for probable missed AB. 9 weeks 6 days by LMP. 9 weeks 0 days by first ultrasound.  EXAM: TRANSVAGINAL OB ULTRASOUND  TECHNIQUE: Transvaginal ultrasound was performed for complete evaluation of the gestation as well as the maternal uterus, adnexal  regions, and pelvic cul-de-sac.  COMPARISON:  08/06/2014 and 07/23/2014  FINDINGS: Intrauterine gestational sac: Present  Yolk sac:  Not seen  Embryo:  Not seen  MSD: 29.8  mm   8 w   2  d  Maternal uterus/adnexae: No subchorionic hemorrhage. The ovaries have a normal appearance. Right corpus luteum cyst is 1.6 x 1.5 x 1.6 cm. No free pelvic fluid.  IMPRESSION: Anembryonic gestation. Given the lack of embryo more than 2 weeks after a presence of gestational sac without yolk sac, the findings meet definitive criteria for failed pregnancy. This follows SRU consensus guidelines: Diagnostic Criteria for Nonviable Pregnancy Early in the First Trimester. Michele Vincent J Med [phone].   Electronically Signed   By: Michele Vincent M.D.   On: 08/17/2014 12:26   US Ob Transvaginal  08/06/2014   CLINICAL DATA:  First trimester pregnancy with uncertain fetal viability.  EXAM: TRANSVAGINAL OB ULTRASOUND  TECHNIQUE: Transvaginal ultrasound was performed for complete evaluation of the gestation as well as the maternal uterus, adnexal regions, and pelvic cul-de-sac.  COMPARISON:  07/23/2014  FINDINGS: Intrauterine gestational sac: Visualized/normal in shape.  Yolk sac:  Abnormal appearance  Embryo:  Not visualized  MSD: 23  mm   7 w   3  d  Maternal uterus/adnexae: Neither ovary directly visualized, however no adnexal mass or free fluid identified.  IMPRESSION: Findings are highly suspicious but not yet definitive for failed pregnancy. Recommend follow-up US in 10 days for definitive diagnosis. This recommendation follows SRU consensus guidelines: Diagnostic Criteria for Nonviable Pregnancy Early in the First Trimester.  Malva Limes Engl J Med 2013; 161:0960-45; 369:1443-51.   Electronically Signed   By: Michele Vincent M.D.   On: 08/06/2014 14:47    Assessment & Plan:  Assessment:  S/P SAB Anxiety Depression Obesity   P: Advised she would be at high risk for another pregnancy Advised to not get pregnant for next 3 months/ use condoms Advised  Paxil is not considered safe in pregnancy Will get quant today for baseline Advised she needs pap smear/ given clinic # RTC 1 year or prn   Michele PhenixLinda M Tsuruko Murtha, NP 09/04/2014 9:50 AM

## 2014-09-04 NOTE — Patient Instructions (Signed)
Aborto espontáneo  °(Miscarriage) °El aborto espontáneo es la pérdida de un bebé que no ha nacido (feto) antes de la semana 20 del embarazo. La mayor parte de estos abortos ocurre en los primeros 3 meses. En algunos casos ocurre antes de que la mujer sepa que está embarazada. También se denomina "aborto espontáneo" o "pérdida prematura del embarazo". El aborto espontáneo puede ser una experiencia que afecte emocionalmente a la persona. Converse con su médico si tiene dudas, cómo es el proceso de duelo, y sobre planes futuros de embarazo.  °CAUSAS  °· Algunos problemas cromosómicos pueden hacer imposible que el bebé se desarrolle normalmente. Los problemas con los genes o cromosomas del bebé son generalmente el resultado de errores que se producen, por casualidad, cuando el embrión se divide y crece. Estos problemas no se heredan de los padres. °· Infección en el cuello del útero.   °· Problemas hormonales.   °· Problemas en el cuello del útero, como tener un útero incompetente. Esto ocurre cuando los tejidos no son lo suficientemente fuertes como para contener el embarazo.   °· Problemas del útero, como un útero con forma anormal, los fibromas o anormalidades congénitas.   °· Ciertas enfermedades crónicas.   °· No fume, no beba alcohol, ni consuma drogas.   °· Traumatismos   °A veces, la causa es desconocida.  °SÍNTOMAS  °· Sangrado o manchado vaginal, con o sin cólicos o dolor. °· Dolor o cólicos en el abdomen o en la cintura. °· Eliminación de líquido, tejidos o coágulos grandes por la vagina. °DIAGNÓSTICO  °El médico le hará un examen físico. También le indicará una ecografía para confirmar el aborto. Es posible que se realicen análisis de sangre.  °TRATAMIENTO  °· En algunos casos el tratamiento no es necesario, si se eliminan naturalmente todos los tejidos embrionarios que se encontraban en el útero. Si el feto o la placenta quedan dentro del útero (aborto incompleto), pueden infectarse, los tejidos que quedan  pueden infectarse y deben retirarse. Generalmente se realiza un procedimiento de dilatación y curetaje (D y C). Durante el procedimiento de dilatación y curetaje, el cuello del útero se abre (dilata) y se retira cualquier resto de tejido fetal o placentario del útero. °· Si hay una infección, le recetarán antibióticos. Podrán recetarle otros medicamentos para reducir el tamaño del útero (contraerlo) si hay una mucho sangrado. °· Si su sangre es Rh negativa y su bebé es Rh positivo, usted necesitará la inyección de inmunoglobulina Rh. Esta inyección protegerá a los futuros bebés de tener problemas de compatibilidad Rh en futuros embarazos. °INSTRUCCIONES PARA EL CUIDADO EN EL HOGAR  °· El médico le indicará reposo en cama o le permitirá realizar actividades livianas. Vuelva a la actividad lentamente o según las indicaciones de su médico. °· Pídale a alguien que la ayude con las responsabilidades familiares y del hogar durante este tiempo.   °· Lleve un registro de la cantidad y la saturación de las toallas higiénicas que utiliza cada día. Anote esta información   °· No use tampones. No No se haga duchas vaginales ni tenga relaciones sexuales hasta que el médico la autorice.   °· Sólo tome medicamentos de venta libre o recetados para calmar el dolor o el malestar, según las indicaciones de su médico.   °· No tome aspirina. La aspirina puede ocasionar hemorragias.   °· Concurra puntualmente a las citas de control con el médico.   °· Si usted o su pareja tienen dificultades con el duelo, hable con su médico para buscar la ayuda psicológica que los ayude a enfrentar la pérdida   del embarazo. Permítase el tiempo suficiente de duelo antes de quedar embarazada nuevamente.   °SOLICITE ATENCIÓN MÉDICA DE INMEDIATO SI:  °· Siente calambres intensos o dolor en la espalda o en el abdomen. °· Tiene fiebre. °· Elimina grandes coágulos de sangre (del tamaño de una nuez o más) o tejidos por la vagina. Guarde lo que ha eliminado para  que su médico lo examine.   °· La hemorragia aumenta.   °· Observa una secreción vaginal espesa y con mal olor. °· Se siente mareada, débil, o se desmaya.   °· Siente escalofríos.   °ASEGÚRESE DE QUE:  °· Comprende estas instrucciones. °· Controlará su enfermedad. °· Solicitará ayuda de inmediato si no mejora o si empeora. °Document Released: 05/03/2005 Document Revised: 11/18/2012 °ExitCare® Patient Information ©2015 [employer]. This information is not intended to replace advice given to you by your health care provider. Make sure you discuss any questions you have with your health care provider. ° °

## 2014-09-04 NOTE — Progress Notes (Signed)
Here for follow up failed pregnancy. States stopped taking buspar and paxil 4 months ago because she was feeling better. We discussed that those medications work best if you take continuously and you may start feeling bad again since you stopped. She states her choice to stop. States has been intermittent lower back pain =5. States was doing fine until miscarriage, now a little depressed. Used Interpreter [employer].

## 2014-09-05 LAB — HCG, QUANTITATIVE, PREGNANCY: hCG, Beta Chain, Quant, S: 8.2 m[IU]/mL

## 2014-10-21 ENCOUNTER — Ambulatory Visit: Payer: Self-pay | Attending: Internal Medicine | Admitting: Internal Medicine

## 2014-10-21 ENCOUNTER — Encounter: Payer: Self-pay | Admitting: Internal Medicine

## 2014-10-21 VITALS — BP 126/83 | HR 102 | Temp 98.0°F | Resp 16 | Wt 165.4 lb

## 2014-10-21 DIAGNOSIS — R350 Frequency of micturition: Secondary | ICD-10-CM | POA: Insufficient documentation

## 2014-10-21 DIAGNOSIS — K2970 Gastritis, unspecified, without bleeding: Secondary | ICD-10-CM

## 2014-10-21 DIAGNOSIS — R109 Unspecified abdominal pain: Secondary | ICD-10-CM

## 2014-10-21 DIAGNOSIS — K219 Gastro-esophageal reflux disease without esophagitis: Secondary | ICD-10-CM | POA: Insufficient documentation

## 2014-10-21 LAB — POCT URINALYSIS DIPSTICK
Bilirubin, UA: NEGATIVE
GLUCOSE UA: NEGATIVE
Ketones, UA: NEGATIVE
Leukocytes, UA: NEGATIVE
NITRITE UA: NEGATIVE
Protein, UA: NEGATIVE
Spec Grav, UA: 1.01
UROBILINOGEN UA: 0.2
pH, UA: 6

## 2014-10-21 LAB — POCT URINE PREGNANCY: Preg Test, Ur: NEGATIVE

## 2014-10-21 MED ORDER — RANITIDINE HCL 300 MG PO TABS: 300.0000 mg | ORAL_TABLET | Freq: Every day | ORAL | Status: DC

## 2014-10-21 MED ORDER — OMEPRAZOLE 40 MG PO CPDR: 40.0000 mg | DELAYED_RELEASE_CAPSULE | Freq: Every day | ORAL | Status: DC

## 2014-10-21 NOTE — Progress Notes (Signed)
MRN: [id number] Name: Michele Vincent  Sex: female Age: 44 y.o. DOB: 01/28/1971  Allergies: Review of patient's allergies indicates no known allergies.  Chief Complaint  Patient presents with  . Follow-up    HPI: Patient is 44 y.o. female who history of chronic abdominal pain, gastritis, in the past she has seen GI had colonoscopy and endoscopy done was prescribed Protonix as per patient it does not help her with the symptoms, currently patient is also complaining of urinary frequency denies any dysuria currently having also menstrual periods.  Past Medical History  Diagnosis Date  . Anxiety   . Depression   . Colitis   . Diverticulosis   . Fatty liver   . Gastritis     Past Surgical History  Procedure Laterality Date  . No past surgeries        Medication List       This list is accurate as of: 10/21/14 12:43 PM.  Always use your most recent med list.               busPIRone 10 MG tablet  Commonly known as:  BUSPAR  Take 10 mg by mouth. 1/2 tablet twice daily     CHLORDIAZEPOXIDE-CLIDINIUM PO  Take 1 capsule by mouth as needed.     metroNIDAZOLE 250 MG tablet  Commonly known as:  FLAGYL  Take 1 tablet (250 mg total) by mouth 3 (three) times daily.     multivitamin-prenatal 27-0.8 MG Tabs tablet  Take 1 tablet by mouth daily at 12 noon.     omeprazole 40 MG capsule  Commonly known as:  PRILOSEC  Take 1 capsule (40 mg total) by mouth daily.     pantoprazole 40 MG tablet  Commonly known as:  PROTONIX  Take 1 tablet (40 mg total) by mouth daily.     PARoxetine 20 MG tablet  Commonly known as:  PAXIL  Take 20 mg by mouth as needed.     ranitidine 300 MG tablet  Commonly known as:  ZANTAC  Take 1 tablet (300 mg total) by mouth at bedtime.     zolpidem 10 MG tablet  Commonly known as:  AMBIEN  Take 10 mg by mouth at bedtime as needed for sleep.        Meds ordered this encounter  Medications  . ranitidine (ZANTAC) 300 MG tablet    Sig:  Take 1 tablet (300 mg total) by mouth at bedtime.    Dispense:  30 tablet    Refill:  3  . omeprazole (PRILOSEC) 40 MG capsule    Sig: Take 1 capsule (40 mg total) by mouth daily.    Dispense:  30 capsule    Refill:  3    Immunization History  Administered Date(s) Administered  . Influenza,inj,Quad PF,36+ Mos 07/09/2014  . Tdap 07/23/2013    Family History  Problem Relation Age of Onset  . Colon cancer Neg Hx   . CVA Mother   . Pneumonia Father     History  Substance Use Topics  . Smoking status: Never Smoker   . Smokeless tobacco: Never Used  . Alcohol Use: No    Review of Systems   As noted in HPI  Filed Vitals:   10/21/14 1131  BP: 126/83  Pulse: 102  Temp: 98 F (36.7 C)  Resp: 16    Physical Exam  Physical Exam  Constitutional: No distress.  Eyes: EOM are normal.  Cardiovascular: Normal rate and regular rhythm.  Pulmonary/Chest: Breath sounds normal. No respiratory distress. She has no wheezes. She has no rales.  Abdominal: Soft. She exhibits no distension. There is no rebound.    CBC    Component Value Date/Time   WBC 8.5 08/17/2014 1429   RBC 4.24 08/17/2014 1429   HGB 13.0 08/17/2014 1429   HCT 37.4 08/17/2014 1429   PLT 212 08/17/2014 1429   MCV 88.2 08/17/2014 1429   LYMPHSABS 1.9 03/24/2014 1100   MONOABS 0.7 03/24/2014 1100   EOSABS 0.0 03/24/2014 1100   BASOSABS 0.0 03/24/2014 1100    CMP     Component Value Date/Time   NA 140 03/24/2014 1100   K 4.3 03/30/2014 1142   CL 104 03/24/2014 1100   CO2 25 03/24/2014 1100   GLUCOSE 108* 03/24/2014 1100   BUN 6 03/24/2014 1100   CREATININE 0.7 03/24/2014 1100   CREATININE 0.76 07/23/2013 1714   CALCIUM 9.7 03/24/2014 1100   PROT 8.1 03/24/2014 1100   ALBUMIN 4.5 03/24/2014 1100   AST 17 03/24/2014 1100   ALT 22 03/24/2014 1100   ALKPHOS 68 03/24/2014 1100   BILITOT 1.3* 03/24/2014 1100   GFRNONAA >89 07/21/2013 0936   GFRNONAA >90 06/18/2013 0600   GFRAA >89 07/21/2013 0936    GFRAA >90 06/18/2013 0600    Lab Results  Component Value Date/Time   CHOL 214* 07/21/2013 09:36 AM    No components found for: HGA1C  Lab Results  Component Value Date/Time   AST 17 03/24/2014 11:00 AM    Assessment and Plan  Gastroesophageal reflux disease, esophagitis presence not specified/Gastritis  - Plan: ranitidine (ZANTAC) 300 MG tablet, omeprazole (PRILOSEC) 40 MG capsule  Have discontinued  Protonix and switch her to Prilosec also prescribed Zantac to take at night, if she has persistent symptoms she will make a followup with gastroenterology.  Urinary frequency - Plan:  Results for orders placed or performed in visit on 10/21/14  Urinalysis Dipstick  Result Value Ref Range   Color, UA yellow    Clarity, UA cloudy    Glucose, UA neg    Bilirubin, UA neg    Ketones, UA neg    Spec Grav, UA 1.010    Blood, UA large    pH, UA 6.0    Protein, UA neg    Urobilinogen, UA 0.2    Nitrite, UA neg    Leukocytes, UA Negative   POCT urine pregnancy  Result Value Ref Range   Preg Test, Ur Negative    Urinalysis Dipstick is negative for infection, patient is having menstrual periods, blood is likely secondary to that.   Health Maintenance  -Pap Smear:following up with the GYN -Mammogram:mammogram has already been ordered patient will schedule appointment -Vaccinations:  Up-to-date with flu shot  Return in about 3 months (around 01/21/2015), or if symptoms worsen or fail to improve.   This note has been created with Education officer, environmental. Any transcriptional errors are unintentional.    Doris Cheadle, MD

## 2014-10-21 NOTE — Progress Notes (Signed)
Patient is here for follow up on her stomach issues Patient states the protonix is not really working and would  Like to try something different

## 2014-10-25 ENCOUNTER — Encounter (HOSPITAL_COMMUNITY): Payer: Self-pay | Admitting: Emergency Medicine

## 2014-10-25 ENCOUNTER — Emergency Department (HOSPITAL_COMMUNITY)
Admission: EM | Admit: 2014-10-25 | Discharge: 2014-10-25 | Disposition: A | Discharge status: Home / Self Care | Payer: Self-pay | Attending: Emergency Medicine | Admitting: Emergency Medicine

## 2014-10-25 DIAGNOSIS — F419 Anxiety disorder, unspecified: Secondary | ICD-10-CM | POA: Insufficient documentation

## 2014-10-25 DIAGNOSIS — F329 Major depressive disorder, single episode, unspecified: Secondary | ICD-10-CM | POA: Insufficient documentation

## 2014-10-25 DIAGNOSIS — K5733 Diverticulitis of large intestine without perforation or abscess with bleeding: Secondary | ICD-10-CM | POA: Insufficient documentation

## 2014-10-25 DIAGNOSIS — R000 Tachycardia, unspecified: Secondary | ICD-10-CM | POA: Insufficient documentation

## 2014-10-25 DIAGNOSIS — Z79899 Other long term (current) drug therapy: Secondary | ICD-10-CM | POA: Insufficient documentation

## 2014-10-25 LAB — URINALYSIS, ROUTINE W REFLEX MICROSCOPIC
Bilirubin Urine: NEGATIVE
Glucose, UA: NEGATIVE mg/dL
HGB URINE DIPSTICK: NEGATIVE
Ketones, ur: NEGATIVE mg/dL
NITRITE: NEGATIVE
Protein, ur: NEGATIVE mg/dL
SPECIFIC GRAVITY, URINE: 1.003 — AB (ref 1.005–1.030)
Urobilinogen, UA: 0.2 mg/dL (ref 0.0–1.0)
pH: 5.5 (ref 5.0–8.0)

## 2014-10-25 LAB — CBC WITH DIFFERENTIAL/PLATELET
BASOS ABS: 0 10*3/uL (ref 0.0–0.1)
Basophils Relative: 0 % (ref 0–1)
EOS ABS: 0.2 10*3/uL (ref 0.0–0.7)
EOS PCT: 1 % (ref 0–5)
HCT: 39.5 % (ref 36.0–46.0)
Hemoglobin: 13.1 g/dL (ref 12.0–15.0)
LYMPHS ABS: 1.8 10*3/uL (ref 0.7–4.0)
Lymphocytes Relative: 16 % (ref 12–46)
MCH: 31.3 pg (ref 26.0–34.0)
MCHC: 33.2 g/dL (ref 30.0–36.0)
MCV: 94.5 fL (ref 78.0–100.0)
Monocytes Absolute: 1 10*3/uL (ref 0.1–1.0)
Monocytes Relative: 8 % (ref 3–12)
NEUTROS ABS: 8.5 10*3/uL — AB (ref 1.7–7.7)
NEUTROS PCT: 75 % (ref 43–77)
Platelets: 196 10*3/uL (ref 150–400)
RBC: 4.18 MIL/uL (ref 3.87–5.11)
RDW: 13.9 % (ref 11.5–15.5)
WBC: 11.4 10*3/uL — AB (ref 4.0–10.5)

## 2014-10-25 LAB — COMPREHENSIVE METABOLIC PANEL
ALT: 19 U/L (ref 0–35)
ANION GAP: 11 (ref 5–15)
AST: 26 U/L (ref 0–37)
Albumin: 3.9 g/dL (ref 3.5–5.2)
Alkaline Phosphatase: 90 U/L (ref 39–117)
BUN: 24 mg/dL — AB (ref 6–23)
CALCIUM: 10.3 mg/dL (ref 8.4–10.5)
CO2: 23 mmol/L (ref 19–32)
Chloride: 108 mmol/L (ref 96–112)
Creatinine, Ser: 1.27 mg/dL — ABNORMAL HIGH (ref 0.50–1.10)
GFR calc Af Amer: 59 mL/min — ABNORMAL LOW (ref >90)
GFR, EST NON AFRICAN AMERICAN: 51 mL/min — AB (ref >90)
GLUCOSE: 118 mg/dL — AB (ref 70–99)
POTASSIUM: 4 mmol/L (ref 3.5–5.1)
SODIUM: 142 mmol/L (ref 135–145)
Total Bilirubin: 0.6 mg/dL (ref 0.3–1.2)
Total Protein: 7.1 g/dL (ref 6.0–8.3)

## 2014-10-25 LAB — URINE MICROSCOPIC-ADD ON

## 2014-10-25 MED ORDER — SODIUM CHLORIDE 0.9 % IV BOLUS (SEPSIS)
1000.0000 mL | Freq: Once | INTRAVENOUS | Status: AC
  Administered 2014-10-25: 1000 mL via INTRAVENOUS

## 2014-10-25 MED ORDER — HYDROCODONE-ACETAMINOPHEN 5-325 MG PO TABS
2.0000 | ORAL_TABLET | Freq: Once | ORAL | Status: AC
  Administered 2014-10-25: 2 via ORAL
  Filled 2014-10-25: qty 2

## 2014-10-25 MED ORDER — CIPROFLOXACIN HCL 500 MG PO TABS: 500.0000 mg | ORAL_TABLET | Freq: Two times a day (BID) | ORAL | Status: AC

## 2014-10-25 MED ORDER — HYDROCODONE-ACETAMINOPHEN 5-325 MG PO TABS: 1.0000 | ORAL_TABLET | Freq: Four times a day (QID) | ORAL | Status: DC | PRN

## 2014-10-25 MED ORDER — METRONIDAZOLE 500 MG PO TABS: 500.0000 mg | ORAL_TABLET | Freq: Three times a day (TID) | ORAL | Status: AC

## 2014-10-25 MED ORDER — KETOROLAC TROMETHAMINE 15 MG/ML IJ SOLN
15.0000 mg | Freq: Once | INTRAMUSCULAR | Status: AC
  Administered 2014-10-25: 15 mg via INTRAVENOUS
  Filled 2014-10-25: qty 1

## 2014-10-25 NOTE — Discharge Instructions (Signed)
Diverticulitis (Diverticulitis) La diverticulitis ocurre cuando pequeos bolsillos que se han formado en el colon (intestino grueso) se infectan o se inflaman. CUIDADOS EN EL HOGAR  Siga las indicaciones del mdico.  Siga una dieta especial si el mdico se lo indic.  Cuando se sienta mejor, el mdico puede indicarle que cambie la dieta. Tal vez le indiquen que coma gran cantidad de [place]. Las frutas y los vegetales son buenas fuentes de [place]fibra. La fibra facilita la evacuacin intestinal (defecacin).  Tome los suplementos o los probiticos como le indic el mdico.  Tome los medicamentos solamente como se lo haya indicado el mdico.  Cumpla con todas las visitas de control con su mdico. SOLICITE AYUDA SI:  El dolor no mejora.  Le resulta difcil alimentarse.  No defeca como lo hace normalmente. SOLICITE AYUDA DE INMEDIATO SI:  El dolor empeora.  Los problemas no mejoran.  Los problemas empeoran repentinamente.  Tiene fiebre.  No deja de vomitar.  La materia fecal (heces) es sanguinolenta o negra, de aspecto alquitranado. ASEGRESE DE QUE:   Comprende estas instrucciones.  Controlar su afeccin.  Recibir ayuda de inmediato si no mejora o si empeora. Document Released: 07/13/2011 Document Revised: 07/29/2013 [facility] Patient Information 2015 [place], [place]. This information is not intended to replace advice given to you by your health care provider. Make sure you discuss any questions you have with your health care provider.

## 2014-10-25 NOTE — ED Notes (Signed)
Went over discharge papers with patient while utilizing translator services. Thoroughly explained pain medications and antibiotics. All questions answered. Explained follow up precautions and dangers of narcotics.

## 2014-10-25 NOTE — ED Provider Notes (Signed)
CSN: [id number]     Arrival date & time 10/25/14  1215 History   First MD Initiated Contact with Patient 10/25/14 1503     Chief Complaint  Patient presents with  . Abdominal Pain     (Consider location/radiation/quality/duration/timing/severity/associated sxs/prior Treatment) Patient is a 44 y.o. female presenting with abdominal pain. The history is provided by the patient. The history is limited by a language barrier. A language interpreter was used (Bahrain[place]).  Abdominal Pain Pain location:  LUQ and LLQ Pain quality: sharp   Pain radiates to:  Does not radiate Pain severity:  Severe Onset quality:  Gradual Timing:  Constant Progression:  Worsening Chronicity:  Chronic (worsening last two days) Relieved by:  Nothing Ineffective treatments:  None tried (on PPI) Associated symptoms: constipation and nausea   Associated symptoms: no diarrhea, no fever, no flatus, no hematuria and no vomiting     Past Medical History  Diagnosis Date  . Anxiety   . Depression   . Colitis   . Diverticulosis   . Fatty liver   . Gastritis    Past Surgical History  Procedure Laterality Date  . No past surgeries     Family History  Problem Relation Age of Onset  . Colon cancer Neg Hx   . CVA Mother   . Pneumonia Father    History  Substance Use Topics  . Smoking status: Never Smoker   . Smokeless tobacco: Never Used  . Alcohol Use: No   OB History    Gravida Para Term Preterm AB TAB SAB Ectopic Multiple Living   2 1 1      1 1      Review of Systems  Constitutional: Negative for fever.  Gastrointestinal: Positive for nausea, abdominal pain and constipation. Negative for vomiting, diarrhea and flatus.  Genitourinary: Negative for hematuria.  All other systems reviewed and are negative.     Allergies  Review of patient's allergies indicates no known allergies.  Home Medications   Prior to Admission medications   Medication Sig Start Date End Date Taking? Authorizing Provider   busPIRone (BUSPAR) 10 MG tablet Take 10 mg by mouth. 1/2 tablet twice daily   Yes Historical Provider, MD  CHLORDIAZEPOXIDE-CLIDINIUM PO Take 1 capsule by mouth as needed.    Yes Historical Provider, MD  FOLIC ACID PO Take 1 tablet by mouth daily.   Yes Historical Provider, MD  Magnesium 250 MG TABS Take 1 tablet by mouth daily.   Yes Historical Provider, MD  omeprazole (PRILOSEC) 40 MG capsule Take 1 capsule (40 mg total) by mouth daily. 10/21/14  Yes Deepak Advani, MD  PARoxetine (PAXIL) 20 MG tablet Take 20 mg by mouth as needed.   Yes Historical Provider, MD  zolpidem (AMBIEN) 10 MG tablet Take 10 mg by mouth at bedtime as needed for sleep.   Yes Historical Provider, MD  ciprofloxacin (CIPRO) 500 MG tablet Take 1 tablet (500 mg total) by mouth every 12 (twelve) hours. 10/26/14 11/04/14  Dorna LeitzAlex Kalvyn Desa, MD  HYDROcodone-acetaminophen (NORCO/VICODIN) 5-325 MG per tablet Take 1 tablet by mouth every 6 (six) hours as needed for moderate pain. 10/25/14   Dorna LeitzAlex Kiyomi Pallo, MD  metroNIDAZOLE (FLAGYL) 500 MG tablet Take 1 tablet (500 mg total) by mouth 3 (three) times daily. 10/26/14 11/04/14  Dorna LeitzAlex Fabrice Dyal, MD  ranitidine (ZANTAC) 300 MG tablet Take 1 tablet (300 mg total) by mouth at bedtime. 10/21/14   Deepak Advani, MD   BP 140/83 mmHg  Pulse 102  Temp(Src) 98.8 F (  37.1 C)  Resp 16  Ht  (1.651 m)  Wt 165 lb (74.844 kg)  BMI 27.46 kg/m2  SpO2 100%  LMP 10/07/2012 Physical Exam  Constitutional: She appears well-developed and well-nourished. No distress.  Well-appearing, pleasant  HENT:  Head: Normocephalic and atraumatic.  Oropharynx dry  Cardiovascular: Regular rhythm, normal heart sounds and intact distal pulses.  Tachycardia present.  Exam reveals no gallop and no friction rub.   No murmur heard. Pulmonary/Chest: Effort normal and breath sounds normal. No respiratory distress. She has no wheezes. She has no rales.  Abdominal: Soft. She exhibits no distension and no mass. There is tenderness  (left hemi-abdomen). There is no rebound and no guarding.  Skin: Skin is warm and dry. No rash noted. She is not diaphoretic.  Psychiatric: She has a normal mood and affect. Her behavior is normal. Judgment and thought content normal.  Nursing note and vitals reviewed.   ED Course  Procedures (including critical care time) Labs Review Labs Reviewed  CBC WITH DIFFERENTIAL/PLATELET - Abnormal; Notable for the following:    WBC 11.4 (*)    Neutro Abs 8.5 (*)    All other components within normal limits  COMPREHENSIVE METABOLIC PANEL - Abnormal; Notable for the following:    Glucose, Bld 118 (*)    BUN 24 (*)    Creatinine, Ser 1.27 (*)    GFR calc non Af Amer 51 (*)    GFR calc Af Amer 59 (*)    All other components within normal limits  URINALYSIS, ROUTINE W REFLEX MICROSCOPIC - Abnormal; Notable for the following:    Specific Gravity, Urine 1.003 (*)    Leukocytes, UA MODERATE (*)    All other components within normal limits  URINE MICROSCOPIC-ADD ON - Abnormal; Notable for the following:    Squamous Epithelial / LPF FEW (*)    Bacteria, UA FEW (*)    All other components within normal limits    Imaging Review No results found.   EKG Interpretation None      MDM   Final diagnoses:  Diverticulitis of large intestine without perforation or abscess with bleeding    44 year old female with past medical history of chronic abdominal pain and IBS presents with left-sided sharp abdominal pain worsening in the last 2 days. Endorses constipation. Endorses nausea but no vomiting. Denies fevers. Presentation concerning for likely diverticulitis given known diverticular disease, slight tachycardia with classic pain, and slight leukocytosis. UA unreflective of UTI and POC preg negative. Her abdominal exam is benign and I have low concern for complications of his diverticulitis. I will fluid hydrate her and treat her pain. Assuming her vital signs normalized, she should be stable for  outpatient antibiotic therapy and PCP follow-up. She will also need returned to her GI physician.  4:20 PM heart rate and pain improved. Uncomplicated diverticulitis will be treated with antibiotics and pain medicines. PCP follow-up already arranged for tomorrow.   Dorna Leitz, MD 10/25/14 1627  Richardean Canal, MD 10/25/14 [phone]

## 2014-10-25 NOTE — ED Notes (Signed)
Sister Cheron Every/translator stated, she has abdominal pain , she was unable to sleep last night.

## 2014-10-26 ENCOUNTER — Ambulatory Visit: Payer: Self-pay | Attending: Family Medicine | Admitting: Family Medicine

## 2014-10-26 ENCOUNTER — Encounter: Payer: Self-pay | Admitting: Family Medicine

## 2014-10-26 VITALS — BP 132/84 | HR 108 | Temp 98.6°F | Resp 18 | Ht 65.0 in | Wt 169.0 lb

## 2014-10-26 DIAGNOSIS — K5792 Diverticulitis of intestine, part unspecified, without perforation or abscess without bleeding: Secondary | ICD-10-CM | POA: Insufficient documentation

## 2014-10-26 DIAGNOSIS — Z01419 Encounter for gynecological examination (general) (routine) without abnormal findings: Secondary | ICD-10-CM | POA: Insufficient documentation

## 2014-10-26 DIAGNOSIS — R1032 Left lower quadrant pain: Secondary | ICD-10-CM

## 2014-10-26 DIAGNOSIS — Z124 Encounter for screening for malignant neoplasm of cervix: Secondary | ICD-10-CM | POA: Insufficient documentation

## 2014-10-26 NOTE — Patient Instructions (Signed)
Mrs. Michele Vincent,  Thank you for coming in today. Your screening pap smear was done today. If normal, the plan is for repeat pap smear in 5 years. You will be called with results.   F/u with Dr. Orpah CobbAdvani in 3 weeks for recent diagnosis of diverticulitis currently on antibiotics  Dr. Armen PickupFunches

## 2014-10-26 NOTE — Progress Notes (Signed)
   Subjective:    Patient ID: Michele Vincent, female    DOB: 07/20/1971, [age] y.o.   MRN: [id number] CC: pap only PCP: Dr. Orpah CobbAdvani  HPI Spanish interpreter present  1. Pap: normal menses. No pelvic pain. No vaginal discharge.   2. Diverticulitis: in ED yesterday. Has hx of LLQ pain in 2014 w/o CT evidence of diverticulitis. Dx with diverticulitis. No fever, severe pain, nausea or emesis. Will pick up prescribed narcotic pain medicine and antibiotics today.   Soc Hx: non smoker  Review of Systems As per HPI     Objective:   Physical Exam BP 132/84 mmHg  Pulse 108  Temp(Src) 98.6 F (37 C) (Oral)  Resp 18  Ht 5\' 5"  (1.651 m)  Wt 169 lb (76.658 kg)  BMI 28.12 kg/m2  SpO2 100%  LMP 10/19/2014 General appearance: alert, cooperative and no distress Lungs: normal WOB  Abdomen: flat, soft, mild TTP LLQ w/o mass, rebound or guarding  Pelvic: cervix normal in appearance, external genitalia normal, no adnexal masses or tenderness, no cervical motion tenderness, rectovaginal septum normal, uterus normal size, shape, and consistency and vagina normal without discharge     Assessment & Plan:

## 2014-10-26 NOTE — Assessment & Plan Note (Signed)
Your screening pap smear was done today. If normal, the plan is for repeat pap smear in 5 years. You will be called with results.

## 2014-10-26 NOTE — Assessment & Plan Note (Signed)
  F/u with Dr. Orpah CobbAdvani in 3 weeks for recent diagnosis of diverticulitis currently on antibiotics

## 2014-10-26 NOTE — Progress Notes (Signed)
Annual pap

## 2014-10-27 LAB — CERVICOVAGINAL ANCILLARY ONLY
Chlamydia: NEGATIVE
NEISSERIA GONORRHEA: NEGATIVE
WET PREP (BD AFFIRM): NEGATIVE
Wet Prep (BD Affirm): NEGATIVE
Wet Prep (BD Affirm): NEGATIVE

## 2014-10-27 LAB — CYTOLOGY - PAP

## 2014-10-28 ENCOUNTER — Telehealth: Payer: Self-pay | Admitting: *Deleted

## 2014-10-28 NOTE — Telephone Encounter (Signed)
Unable to contact Pt Not accepting call at this time  

## 2014-10-28 NOTE — Telephone Encounter (Signed)
-----   Message from Dessa PhiJosalyn Funches, MD sent at 10/27/2014  9:04 AM EDT ----- Negative wet prep. Negative Gc/chlam

## 2014-10-29 ENCOUNTER — Telehealth: Payer: Self-pay | Admitting: *Deleted

## 2014-10-29 NOTE — Telephone Encounter (Signed)
Unable to contact Pt Not accepting call at this time  

## 2014-10-29 NOTE — Telephone Encounter (Signed)
-----   Message from Dessa PhiJosalyn Funches, MD sent at 10/28/2014  7:54 PM EDT ----- Negative pap, repeat in 5 years

## 2014-11-13 ENCOUNTER — Encounter: Payer: Self-pay | Admitting: *Deleted

## 2014-12-10 ENCOUNTER — Encounter: Payer: Self-pay | Admitting: Internal Medicine

## 2014-12-10 ENCOUNTER — Ambulatory Visit (INDEPENDENT_AMBULATORY_CARE_PROVIDER_SITE_OTHER): Payer: Self-pay | Admitting: Internal Medicine

## 2014-12-10 VITALS — BP 128/88 | HR 104 | Ht 60.75 in | Wt 168.4 lb

## 2014-12-10 DIAGNOSIS — K589 Irritable bowel syndrome without diarrhea: Secondary | ICD-10-CM

## 2014-12-10 DIAGNOSIS — K5730 Diverticulosis of large intestine without perforation or abscess without bleeding: Secondary | ICD-10-CM

## 2014-12-10 MED ORDER — DICYCLOMINE HCL 20 MG PO TABS: 20.0000 mg | ORAL_TABLET | Freq: Two times a day (BID) | ORAL | Status: DC

## 2014-12-10 NOTE — Patient Instructions (Signed)
Please continue omeprazole and ranitidine.  We have sent the following medications to your pharmacy for you to pick up at your convenience: Bentyl 20 mg-1 tablet every 12 hours  Please purchase the following medications over the counter and take as directed: Lactaid tablets-take 1 before each meal  We have given you a copy of a lactose free diet.  Please follow up with Michele Vincent on Wednesday, 02/10/15 @ 9:30 am.  ____________________________________________________________________________________________________  Michele Vincent .   Hemos enviado los siguientes medicamentos a la farmacia para que usted pueda recoger a su conveniencia : Bentyl 20 mg - 1 comprimido cada 12 horas   Deber adquirir los siguientes medicamentos de venta libre y tomar segn las indicaciones : Lactaid comprimidos de tomar 1 antes de cada comida   Les hemos dado una copia de una dieta libre de [place] Sebastianlactosa.   Por favor, siga con el Michele Vincent el mircoles 02/10/15 @ 9:30 de la maana .  Dieta libre de lactosa (Lactose-Free Diet) La lactosa es un carbohidrato que se encuentra principalmente en la [place]che y los productos lcteos, como tambin en alimentos con [place] y suero agregados. Para que la lactosa pueda ser Kazakhstanutilizada por el cuerpo, debe ser digerida por una enzima. La intolerancia a la lactosa ocurre cuando hay una escasez de Round Valleylactasa. Cuando su cuerpo no puede digerir la lactosa, puede sentir nuseas, hinchazn, calambres, gases y Guineadiarrea. TIPOS DE DEFICIENCIA DE LACTASA  Deficiencia de lactasa primaria. ste es el tipo ms comn. Se caracteriza por una reduccin lenta de la actividad de la lactasa.  Deficiencia de Altamease Oilerlactasa secundaria. Esto ocurre luego de una lesin en la mucosa del intestino delgado como consecuencia de una enfermedad. Tambin puede producirse luego de [place] and Herzegovinauna ciruga o de un tratamiento con antibiticos o medicamentos para Animatortratar el cncer. La tolerancia a la lactosa vara  ampliamente, y cada persona debe determinar cunta cantidad de [place]leche puede consumir para no desarrollar sntomas. Beber pequeas porciones de Merck & Coleche durante el da puede ser de Freder[place]burgayuda. Algunos estudios sugieren que retardar el vaciamiento gstrico puede ayudar a aumentar la tolerancia a productos lcteos. Esto puede realizarse mediante:  El consumo de [place] o productos lcteos acompaado de otros alimentos, Teacher, English as a foreign languageen lugar de consumirlos solos.  Consumir leche con un mayor contenido graso. Existen muchos productos lcteos que International aid/development workeralgunas personas pueden tolerar mejor que la leche:  El queso (especialmente queso aejo) - el contenido de lactosa es mucho menor que en la [place]alleche.  El consumo de productos lcteos cultivados, como yogur, suero de Alvordleche, requesn, y Azerbaijanleche de [address] Avenueacidfilus dulce (kfir) normalmente es bien tolerado por individuos con deficiencia de Building control surveyorlactasa. Esto ocurre porque las bacterias ayudan a Therapist, nutritionaldigerir la lactosa.  La leche con lactosa hidrolizada contiene un 40-90% menos de lactosa que la Charlotteleche y tambin puede ser Shelbytolerada. REQUERIMIENTOS Estas dietas pueden ser deficientes en calcio, riboflavina, y vitamina D, segn los Recommended Dietary Allowances (cantidades recomendadas en la dieta) del Exxon Mobil Corporationational Research Council (Illinois Tool WorksConsejo Nacional de Branchvillenvestigacin). Es posible que se puedan PepsiCoalcanzar los valores recomendados, esto depende de la Byesvilletolerancia individual y el consumo de sustitutos de Herm[place] Beachleche, [place] Heightsleche, u otros productos lcteos. NOTAS ESPECIALES  La lactosa es un carbohidrato. La principal fuente de alimento son los productos lcteos. Es Secondary school teacherimportante leer los rtulos de los alimentos. Muchos productos contienen lactosa aunque no hayan sido hechos a partir de Freescale Semiconductorla leche. Busque las siguientes palabras: Suero, slidos lcteos, slidos lcteos deshidratados, polvo de Azerbaijanleche sin grasa. Entre las fuentes comunes de  lactosa adems de los productos lcteos se incluyen panes, caramelos, embutidos, alimentos preparados y  procesados, y salsas y caldos comerciales.  Todos los alimentos deben prepararse sin [place], crema u otros productos lcteos.  Puede ser necesario un suplemento de vitaminas o minerales. Consulte con su mdico o nutricionista registrado.  La lactosa tambin se encuentra en muchos medicamentos de prescripcin o de venta libre.  Puede consumir leche de soja y suplementos libres de lactosa como alternativa a la Seltzer. ELECCIN DE LOS ALIMENTOS Panes/Fculas  Permitidos:  Panes y bollos hechos sin leche. Pan francs, de [place], [place]. Galletas de soda, galletas graham. Cualquier galleta preparada sin lactosa. Cereales cocidos o deshidratados preparados sin lactosa (vea el rtulo). Patatas, pastas, o arroz, preparados sin leche o [place]. [place] de maz.  Evite:  Panes y bollos hechos que [employer]oco[employer]illips. Mezclas preparadas como pasteles, bizcochos, buuelos, panqueques. [place]quillas dulces, donas, tostada francesa (si contiene [place]). Bizcochos tostados, o cualquiera galletas que [employer]ex Corporation. Cereales cocidos o deshidratados preparados con lactosa (vea el rtulo). Pur de papas instantneo, papas fritas congeladas, papas festoneadas o gratinadas. Vegatales  Permitidos:  Programmer, systems, congelados o enlatados.  Evite:  Vegetales con crema o rebozados. Vegetales en salsa de queso o con margarinas que contengan lactosa. Frutas  Permitidos:  Nils Pyle frescas, enlatadas o congeladas que no estn procesadas con lactosa.  Evite:  Frutas enlatadas o congeladas que hayan sido procesadas con lactosa. Carnes y sustitutos  Permitidos:  Bife, pollo, pescado, pavo, cordero, ternero, cerdo o jamn. Productos preparados con carne. Alimentos crnicos preparados para bebs que no contengan leche. Huevos, soja, frutos secos.  Evite:  Huevos revueltos, omelettes y souffles que ConocoPhillips. Scrambled eggs, omelets, and souffles that contain milk. Carne, pescado o aves de corral con crema  o empanadas. Salchichas de viena, leverwurst o fiambres que contengan slidos de Circle. Queso, queso cottage o queso untable. Leche  Permitidos:  Careers information officer.  Evite:  Leche (entera, al 2%, descremada o chocolatada). Evaporada, en polvo o condensada. Leche malteada. Sopas y alimentos combinados  Permitidos:  Sopa, caldo, sopa de verduras, consoms. Sopas preparadas en casa con los alimentos permitidos. Alimentos combinados o preparados que no contengan leche ni productos lcteos (lea las etiquetas).  Evite:  Sopas crema, en latas. Sopas comerciales que contengan lactosa Macaroni con queso, pizza. Alimentos combinados o preparados que contengan leche o productos lcteos. Postres y dulces  Permitidos:  Helados de agua y de fruta, gelatina, torta ngel. Galletitas, tortas, pasteles caseros preparados con los ingredientes permitidos. Budn (preparado con agua o sustituto de [place]). Postres de tofu sin lactosa. Azcar, miel, jarabe de maz, mermelada, gelatina, dulces, melaza (azcar de caa). Caramelos de azcar, marshmallows.  Evite:  Helados de crema, sorbetes, flan, budn, yougur helado. Mezclas comerciales para preparar tortas y galletitas. Postres que contengan chocolate. Masa para pastel que Clinical research associate, postres reducidos en caloras preparados con sustitutos del azcar que contangan lactosa. Caramelos toffee, de menta, caramelos duros, chocolate. Grases y aceites  Permitidos:  Occupational hygienist, (segn la tolerancia; contiene muy pequea cantidad de [place]). Margarinas y [employer] no contengan [place], aceites [place], [place], [place], crema artificial y coberturas sin lactosa ni slidos de Physiological scientist. [place].  Evite:  Margarinas y aderezos para [address] que contengan [place]; [place], [place] de man con slidos de [place] agregados, crema agria, bocaditos preparados con crema agria. Bebidas  Permitidos:  Bebidas carbonatadas, t, caf, y caf soluble Carbonated drinks,  tea, caf y caf soluble, algunos cafs instantneos (verifique las  etiquetas). Bebidas frutales, jugos de frutas y de [address]vegetales, Azerbaijanleche de arroz o de soja.  Evite: Chocolate caliente. Algunos cacaos, algunos cafs instantneos, ts instantneos, jugos en polvo (lea las etiquetas). Condimentos  Permitidos:  Salsa de soja, polvo de algarroba, Toolevilleaceitunas, salsa preparada con agua, cacao, condimentos y especias, glutamato monosdico, catsup, mostaza.  Evite:  Algunas gomas de mascar, chocolate, algunos cacaos. Ciertos antibiticos y preparados de vitaminas y minerales. Condimentos que contengan productos lcteos. Endulzantes artificieles que contengan lactosa. Algunas cremas no lcteas (lea las etiquetas). EJEMPLO DE MEN Desayuno   Jugo de naranjas  Pltano  Bran flakes  Desnatador no lcteo  Pan de Viena (brind)  [place] o Psychiatristmargarinas y Mining engineeraderezos que no Therapist, sportscontengan leche  T o caf [place]  Doctor, hospitalechuga de pollo  Arroz  Habichuelas  [place] o Psychiatristmargarinas y aderezos que no contengan leche  Meln fresco  T o caf Cena  Ase carne de vaca  Papa asada  [place] o margarinas y aderezos que no contengan leche  Brcol  Ensalada de Company secretarylechuga con el vinagre y el petrleo que visten  Bizcocho de alimento de ElizabethtownAngel  T [place]o caf Document Released: 07/24/2005 Document Revised: 10/16/2011 ExitCare Patient Information 2015 [place] GroveExitCare, [place]LLC. This information is not intended to replace advice given to you by your health care provider. Make sure you discuss any questions you have with your health care provider.

## 2014-12-10 NOTE — Progress Notes (Signed)
Patient ID: Michele Vincent, female   DOB: 10/13/1970, 44 y.o.   MRN: [id number]     History of Present Illness: Michele Vincent is a delightful 44 year old Hispanic female who is accompanied by a medical Spanish interpreter. Michele Vincent has a history of abdominal pain and bloating as well as anxiety and depression. She had an upper endoscopy and colonoscopy in September 2015. Upper endoscopy was normal and gastric biopsy showed mild chronic gastritis but no H. pylori dysplasia or malignancy. Colonoscopy to the terminal ileum was normal other than moderate diverticulosis throughout the colon from ascending to sigmoid. She tried Levsin with no relief and tried the practice with varying results of relief. She was last seen here in January. It was explained to her that her symptoms were most consistent with IBS. She was given a trial of Flagyl 250 mg 3 times daily for 14 days for irritable bowel and possible bacterial overgrowth she does not remember if it helped. She was in the emergency room in mid March for abdominal pain. The CT was not done. CBC had a white blood cell count of 11. She was empirically treated for diverticulitis but says the medications did not make any change in her symptoms. She describes abdominal pain on a daily basis that is not present when she gets up in the morning but starts after she eats she reports that within half an hour to an hour after she eats she begins to have bloating, gas, and flatulence. She has a bowel movement on a daily basis but sometimes her stools are hard nuggets. She has had no bright red blood per rectum or melena. She has no abdominal pain at night when she goes to bed. Her appetite has been good. She has no associated nausea or vomiting. She is able to relate that if she has milk or dairy products her gas and cramping are much worse.   Past Medical History  Diagnosis Date  . Anxiety   . Depression   . Colitis   . Diverticulosis   . Fatty liver   .  Gastritis   . IBS (irritable bowel syndrome)     Past Surgical History  Procedure Laterality Date  . No past surgeries     Family History  Problem Relation Age of Onset  . Colon cancer Neg Hx   . CVA Mother   . Pneumonia Father    History  Substance Use Topics  . Smoking status: Never Smoker   . Smokeless tobacco: Never Used  . Alcohol Use: No   Current Outpatient Prescriptions  Medication Sig Dispense Refill  . busPIRone (BUSPAR) 10 MG tablet Take 10 mg by mouth. 1/2 tablet twice daily    . Magnesium 250 MG TABS Take 1 tablet by mouth daily.    Marland Kitchen. omeprazole (PRILOSEC) 40 MG capsule Take 1 capsule (40 mg total) by mouth daily. 30 capsule 3  . PARoxetine (PAXIL) 20 MG tablet Take 20 mg by mouth as needed.    . ranitidine (ZANTAC) 300 MG tablet Take 1 tablet (300 mg total) by mouth at bedtime. 30 tablet 3  . dicyclomine (BENTYL) 20 MG tablet Take 1 tablet (20 mg total) by mouth every 12 (twelve) hours. 60 tablet 2   No current facility-administered medications for this visit.   No Known Allergies    Review of Systems: Per history of present illness otherwise negative    Physical Exam: General: Pleasant, well developed female in no acute distress Head: Normocephalic and atraumatic  Eyes:  sclerae anicteric, conjunctiva pink  Ears: Normal auditory acuity Lungs: Clear throughout to auscultation Heart: Regular rate and rhythm Abdomen: Soft, non distended, non-tender. No masses, no hepatomegaly. Normal bowel sounds Musculoskeletal: Symmetrical with no gross deformities  Extremities: No edema  Neurological: Alert oriented x 4, grossly nonfocal Psychological:  Alert and cooperative. Normal mood and affect  Assessment and Recommendations: 44 year old female with past medical history of abdominal pain, bloating, cramping seen in follow-up. Her symptoms are most consistent with IBS. Endoscopic evaluations have been nonrevealing. Patient has been instructed to use Lactaid  tablets before every meal. She will also be given a trial of Bentyl 20 mg twice a day. She has been instructed to increase fiber intake and increase her water intake. She will follow-up in 2 months, sooner if needed.   Yusuf Yu, Tollie PizzaLori P PA-C 12/10/2014,  Addendum: Reviewed and agree with management of IBS symptoms and likely lactose intolerance. Beverley FiedlerJay M Pyrtle, MD

## 2015-01-06 ENCOUNTER — Ambulatory Visit: Payer: Self-pay | Attending: Internal Medicine

## 2015-02-10 ENCOUNTER — Encounter: Payer: Self-pay | Admitting: Internal Medicine

## 2015-02-10 ENCOUNTER — Ambulatory Visit (INDEPENDENT_AMBULATORY_CARE_PROVIDER_SITE_OTHER): Payer: Self-pay | Admitting: Internal Medicine

## 2015-02-10 VITALS — BP 116/84 | HR 84 | Ht 60.75 in | Wt 170.8 lb

## 2015-02-10 DIAGNOSIS — R140 Abdominal distension (gaseous): Secondary | ICD-10-CM

## 2015-02-10 DIAGNOSIS — G8929 Other chronic pain: Secondary | ICD-10-CM

## 2015-02-10 DIAGNOSIS — K589 Irritable bowel syndrome without diarrhea: Secondary | ICD-10-CM

## 2015-02-10 DIAGNOSIS — R1084 Generalized abdominal pain: Secondary | ICD-10-CM

## 2015-02-10 DIAGNOSIS — F418 Other specified anxiety disorders: Secondary | ICD-10-CM

## 2015-02-10 DIAGNOSIS — F419 Anxiety disorder, unspecified: Secondary | ICD-10-CM

## 2015-02-10 DIAGNOSIS — F329 Major depressive disorder, single episode, unspecified: Secondary | ICD-10-CM

## 2015-02-10 MED ORDER — DULOXETINE HCL 60 MG PO CPEP: 60.0000 mg | ORAL_CAPSULE | Freq: Every day | ORAL | Status: DC

## 2015-02-10 MED ORDER — DULOXETINE HCL 30 MG PO CPEP: ORAL_CAPSULE | ORAL | Status: DC

## 2015-02-10 NOTE — Progress Notes (Signed)
Subjective:    Patient ID: Michele Vincent, female    DOB: 1970-08-12, 44 y.o.   MRN: [id number]  HPI Michele Vincent is a 44 yo female with PMH of anxiety and depression, IBS and chronic abd pain who is seen in followup.  She was last seen by Michele Filler, PA-C on 12/10/2014 and before this by me in January 2016. At her last visit she was continuing to have irritable bowel symptoms consistent with abdominal pain, gas and bloating. She was given Flagyl for 14 days for irritable bowel and possible bacterial overgrowth. Librax was discontinued previously as it was not helpful. Probiotic was recommended but she admits this was never started because she forgot. She did try dicyclomine 20 mg twice daily and is found this to be completely unhelpful.  She continues to be troubled with constant left-sided abdominal pain occasionally in the right side and occasionally diffuse. This is difficult to describe it can be burning in nature. It seems to be worse with eating. She's tried avoiding the areas foods without benefit. It is associated with abdominal bloating. She's having 2-3 bowel movements a day without blood or melena. She denies diarrhea or constipation. She's having some nausea at times but no vomiting. No heartburn. She's taking omeprazole 40 mg twice daily and Zantac 300 mg at bedtime.  She is off paroxetine for 7 or 8 months but plans to restart this this week. It has been prescribed by primary care and she previously took this with great benefit to her anxiety and depression. She also is getting ready to restart buspirone. When asked why she stopped these medications is because her anxiety and depression got better. She knows this was not the right decision. She's been unable to work due to anxiety. 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 It was very hot and she developed an anxiety attack and had to go home. She reports anxiety worsens her abdominal pain. No SI or  HI.  Review of Systems As per history of present illness, otherwise negative  Current Medications, Allergies, Past Medical History, Past Surgical History, Family History and Social History were reviewed in [employer] record.     Objective:   Physical Exam BP 116/84 mmHg  Pulse 84  Ht 5' 0.75" (1.543 m)  Wt 170 lb 12.8 oz (77.474 kg)  BMI 32.54 kg/m2  LMP 06/09/2014 Constitutional: Well-developed and well-nourished. No distress. HEENT: Normocephalic and atraumatic. Oropharynx is clear and moist. No oropharyngeal exudate. Conjunctivae are normal.  No scleral icterus. Neck: Neck supple. Trachea midline. Cardiovascular: Normal rate, regular rhythm and intact distal pulses. No M/R/G Pulmonary/chest: Effort normal and breath sounds normal. No wheezing, rales or rhonchi. Abdominal: Soft, diffuse mild discomfort without rebound or guarding, nondistended. Bowel sounds active throughout. There are no masses palpable. No hepatosplenomegaly. Extremities: no clubbing, cyanosis, or edema Lymphadenopathy: No cervical adenopathy noted. Neurological: Alert and oriented to person place and time. Skin: Skin is warm and dry. No rashes noted. Psychiatric: Normal mood and affect. Behavior is normal.  EGD colonoscopy September 2015 -- EGD largely unremarkable mild chronic gastritis by biopsy but no H. pylori. Colonoscopy to the terminal ileum showed moderate diverticulosis throughout the colon but no polyps or inflammation.  CT scan 2014 -- abdomen and pelvis with contrast performed for abdominal pain, mild fatty liver,  diverticulosis without acute abnormality     Assessment & Plan:  44 yo female with PMH of anxiety and depression, IBS and chronic  abd pain who is seen in followup.  1. IBS with chronic abd pain and bloating -- long discussion today regarding her chronic abdominal pain. Reassurance provided. This is felt consistent with irritable bowel worsened by anxiety and  depression, see #2. Likely on excessive acid suppression. Decrease omeprazole to 40 mg once daily and ranitidine to 150 mg daily at bedtime. 2 weeks of align samples 1 tablet daily. If beneficial she can purchase OTC. Discontinue dicyclomine as not effective. Reassurance provided but will start duloxetine 30 mg 7 days increase to 60 mg thereafter. This will likely improve chronic abdominal pain and irritable bowel symptoms but also anxiety and depression.  2. Anxiety and depression -- previous improvement with Paxil. Not currently taking with plans to resume. Discontinue Paxil, in favor of duloxetine 30 mg 1 week increase to 60 mg daily thereafter. Hopefully this will improve anxiety, depression and irritable bowel symptoms which are exacerbated by mood and anxiousness. We discussed that if she has paradoxical worsening of anxiety, depression, suicidal or homicidal thoughts that she needs to discontinue the medicine immediately and seek emergency care. She voices understanding. She can resume buspirone 5 mg twice a day  Follow-up in 8-12 weeks, sooner if necessary

## 2015-02-10 NOTE — Patient Instructions (Addendum)
Discontinue Dicyclomine  Decrease Ranitidine to 150 mg every night at bedtime Decrease Omeprazole to 40 mg daily We are sending in your Cymbalta to your pharmacy When starting Cymbalta if you have increased episodes of Anxiety and depression stop taking immediately and call Dr Rhea BeltonPyrtle or PCP Your appointment with Dr Rhea BeltonPyrtle is scheduled on 04/15/2015 at 9:30am  Take Align daily for 2 weeks

## 2015-04-15 ENCOUNTER — Ambulatory Visit (INDEPENDENT_AMBULATORY_CARE_PROVIDER_SITE_OTHER): Payer: Self-pay | Admitting: Internal Medicine

## 2015-04-15 ENCOUNTER — Encounter: Payer: Self-pay | Admitting: Internal Medicine

## 2015-04-15 VITALS — BP 130/70 | HR 64 | Ht 65.0 in | Wt 172.4 lb

## 2015-04-15 DIAGNOSIS — K589 Irritable bowel syndrome without diarrhea: Secondary | ICD-10-CM

## 2015-04-15 DIAGNOSIS — R109 Unspecified abdominal pain: Secondary | ICD-10-CM

## 2015-04-15 DIAGNOSIS — G8929 Other chronic pain: Secondary | ICD-10-CM

## 2015-04-15 DIAGNOSIS — F419 Anxiety disorder, unspecified: Secondary | ICD-10-CM

## 2015-04-15 MED ORDER — CILIDINIUM-CHLORDIAZEPOXIDE 2.5-5 MG PO CAPS: ORAL_CAPSULE | ORAL | Status: DC

## 2015-04-15 NOTE — Patient Instructions (Addendum)
We have sent the following medications to your pharmacy for you to pick up at your convenience: Librax 1-2 tablets twice daily as needed (use sparingly)  Please continue Buspar 5 mg twice daily.  Continue Cymbalta 60 mg daily.  Please follow up with Dr Rhea Belton in 3-4 months.  ______________________________________________ [employer] siguientes medicamentos a la farmacia para que usted pueda recoger a su conveniencia : Librax 1-2 tabletas [employer] al da segn sea necesario ( usar con moderacin)  Por favor contine Buspar 5 mg [employer] .  Continuar Cymbalta 60 mg al da .  Por favor, siga con el Dr. Rhea Belton en 3-4 meses .

## 2015-04-15 NOTE — Progress Notes (Signed)
   Subjective:    Patient ID: Michele Vincent, female    DOB: 1970/12/23, 44 y.o.   MRN: [id number]  HPI Michele Vincent is a 44 year old female with past medical history of IBS, chronic abdominal pain, anxiety and depression who seen for follow-up. She was last seen on 02/10/2015. After her last visit we started her on Cymbalta 30 mg which she increased after one week to 60 mg daily. She has continued omeprazole and ranitidine 40 mg and 150 mg, respectively which she is taking both at night. She is seen today with the help of a Spanish interpreter. She reports that she is feeling overall considerably better. Abdominal pain has improved greatly. Still having some left-sided abdominal discomfort in the morning or when she becomes stressed or worried. Nausea has improved. Bloating has improved. Bowel movements been regular occurring 1-2 times daily. No blood in her stool or melena. Appetite is also seemingly improved some.   Review of Systems As per history of present illness, otherwise negative  Current Medications, Allergies, Past Medical History, Past Surgical History, Family History and Social History were reviewed in [employer] record.     Objective:   Physical Exam BP 130/70 mmHg  Pulse 64  Ht  (1.651 m)  Wt 172 lb 6.4 oz (78.2 kg)  BMI 28.69 kg/m2  LMP 06/09/2014 Constitutional: Well-developed and well-nourished. No distress. HEENT: Normocephalic and atraumatic. Marland Kitchen Conjunctivae are normal.  No scleral icterus. Neck: Neck supple. Trachea midline. Cardiovascular: Normal rate, regular rhythm and intact distal pulses. No M/R/G Pulmonary/chest: Effort normal and breath sounds normal. No wheezing, rales or rhonchi. Abdominal: Soft, nontender, nondistended. Bowel sounds active throughout. Extremities: no clubbing, cyanosis, or edema Neurological: Alert and oriented to person place and time. Skin: Skin is warm and dry. No rashes noted. Psychiatric:  Normal mood and affect. Behavior is normal.    EGD colonoscopy September 2015 -- EGD largely unremarkable mild chronic gastritis by biopsy but no H. pylori. Colonoscopy to the terminal ileum showed moderate diverticulosis throughout the colon but no polyps or inflammation.  CT scan 2014 -- abdomen and pelvis with contrast performed for abdominal pain, mild fatty liver, diverticulosis without acute abnormality    Assessment & Plan:  44 year old female with past medical history of IBS, chronic abdominal pain, anxiety and depression who seen for follow-up.  1. IBS with chronic abd pain -- considerable improvement with Cymbalta. We'll continue Cymbalta 60 mg daily. We'll also continue buspirone 5 mg twice a day. She does have symptoms particularly with stress and also with panic attacks. For her intermittent abdominal pain associated with stress and IBS I recommended Librax 1-2 tablets twice a day when necessary. I would like her to use this very very sparingly and not on a regular basis. We did discuss that this medication can be habit forming. She voices understanding and will use sparingly.  2. Anxiety and depression -- improved with Cymbalta. Still having some panic attacks. I've asked that she discuss this with primary care. See #1  Follow-up in 6 months, sooner if necessary

## 2015-08-10 ENCOUNTER — Other Ambulatory Visit: Payer: Self-pay | Admitting: Internal Medicine

## 2015-08-10 MED FILL — DULoxetine HCL 60 MG CPEP: 60 | 30 days supply | Qty: 30 | Fill #3

## 2015-08-13 ENCOUNTER — Ambulatory Visit: Payer: Self-pay

## 2015-10-05 ENCOUNTER — Encounter: Payer: Self-pay | Admitting: Internal Medicine

## 2015-10-05 ENCOUNTER — Ambulatory Visit (INDEPENDENT_AMBULATORY_CARE_PROVIDER_SITE_OTHER): Payer: Self-pay | Admitting: Internal Medicine

## 2015-10-05 VITALS — BP 118/70 | HR 64 | Ht 65.0 in | Wt 184.0 lb

## 2015-10-05 DIAGNOSIS — F32A Depression, unspecified: Secondary | ICD-10-CM

## 2015-10-05 DIAGNOSIS — G4700 Insomnia, unspecified: Secondary | ICD-10-CM

## 2015-10-05 DIAGNOSIS — F329 Major depressive disorder, single episode, unspecified: Secondary | ICD-10-CM

## 2015-10-05 DIAGNOSIS — F411 Generalized anxiety disorder: Secondary | ICD-10-CM

## 2015-10-05 DIAGNOSIS — K589 Irritable bowel syndrome without diarrhea: Secondary | ICD-10-CM

## 2015-10-05 MED ORDER — DULOXETINE HCL 60 MG PO CPEP: 60.0000 mg | ORAL_CAPSULE | Freq: Every day | ORAL | Status: DC

## 2015-10-05 MED ORDER — ZOLPIDEM TARTRATE 10 MG PO TABS: 10.0000 mg | ORAL_TABLET | Freq: Every evening | ORAL | Status: DC | PRN

## 2015-10-05 MED ORDER — BUSPIRONE HCL 10 MG PO TABS: 10.0000 mg | ORAL_TABLET | Freq: Two times a day (BID) | ORAL | Status: DC

## 2015-10-05 MED FILL — busPIRone HCL 10 MG TABS: 10 | 30 days supply | Qty: 60 | Fill #0

## 2015-10-05 MED FILL — DULoxetine HCL 60 MG CPEP: 60 | 30 days supply | Qty: 30 | Fill #0

## 2015-10-05 NOTE — Patient Instructions (Addendum)
Please discontinue omeprazole.  Use ranitidine as needed.  We have sent the following medications to your pharmacy for you to pick up at your convenience: Cymbalta 60 mg daily. Buspar 10 mg twice daily Ambien 10 mg every night as needed for sleep  Please follow up with  Dr Rhea Belton in 6 months.

## 2015-10-05 NOTE — Progress Notes (Signed)
   Subjective:    Patient ID: Michele Vincent, female    DOB: 04/15/1971, 45 y.o.   MRN: [id number]  HPI Michele Vincent is a 45 year old female with a history of IBS, chronic abdominal pain, anxiety and depression who is here for follow-up. She was last seen in September 2016. She reports that she definitely does better with Cymbalta and buspirone and this has improved both her anxiety, depression and chronic abdominal pain. Is also improved her overall issues with gas, bloating and nausea. She is not having heartburn and is actually not been using omeprazole. She ran out of Cymbalta recently and would like to restart. She also has lost insurance and therefore lost access to [facility] where she was getting mental health counseling. She has been stressed recently particularly as it relates to the overall political environment in the [place] as it relates to immigration. Stress definitely makes her abdominal pain and irritable bowel worse. She seems to have a full understanding of this. Sleep is also been impaired and she requests refill of Ambien until she can establish with another mental health provider that she can afford. She does not use Ambien every night.  Review of Systems As per history of present illness, otherwise negative  Current Medications, Allergies, Past Medical History, Past Surgical History, Family History and Social History were reviewed in [employer] record.     Objective:   Physical Exam BP 118/70 mmHg  Pulse 64  Ht  (1.651 m)  Wt 184 lb (83.462 kg)  BMI 30.62 kg/m2 Constitutional: Well-developed and well-nourished. No distress. HEENT: Normocephalic and atraumatic. Marland Kitchen Conjunctivae are normal.  No scleral icterus. Neck: Neck supple. Trachea midline. Cardiovascular: Normal rate, regular rhythm and intact distal pulses. No M/R/G Pulmonary/chest: Effort normal and breath sounds normal. No wheezing, rales or rhonchi. Abdominal: Soft,  nontender, nondistended. Bowel sounds active throughout.  Extremities: no clubbing, cyanosis, or edema Neurological: Alert and oriented to person place and time. Skin: Skin is warm and dry. No rashes noted. Psychiatric: Normal mood and affect. Behavior is normal.    EGD colonoscopy September 2015 -- EGD largely unremarkable mild chronic gastritis by biopsy but no H. pylori. Colonoscopy to the terminal ileum showed moderate diverticulosis throughout the colon but no polyps or inflammation.    Assessment & Plan:  45 year old female with a history of IBS, chronic abdominal pain, anxiety and depression who is here for follow-up.  1.  IBS with chronic abd pain -- considerable improvement with Cymbalta and buspirone. I have recommended continuation of this therapy. Continue Cymbalta 60 mg daily and buspirone 10 mg twice a day. She has a good understanding of how stress and anxiety affect her IBS.  2. Insomnia -- refill provided for Ambien 10 mg daily at bedtime. I have advised that she use this on an as-needed basis only and avoid using this nightly. We discussed this medication can be habit-forming.   I have also encouraged her to seek another mental health provider when possible Six-month follow-up 25 minutes spent with the patient today. Greater than 50% was spent in counseling and coordination of care with the patient

## 2015-10-18 ENCOUNTER — Ambulatory Visit: Payer: Self-pay | Attending: Family Medicine

## 2015-11-10 MED FILL — DULoxetine HCL 60 MG CPEP: 60 | 30 days supply | Qty: 30 | Fill #1

## 2015-11-10 MED FILL — busPIRone HCL 10 MG TABS: 10 | 30 days supply | Qty: 60 | Fill #1

## 2015-12-05 ENCOUNTER — Emergency Department (HOSPITAL_COMMUNITY)
Admission: EM | Admit: 2015-12-05 | Discharge: 2015-12-05 | Disposition: A | Discharge status: Home / Self Care | Payer: No Typology Code available for payment source | Attending: Emergency Medicine | Admitting: Emergency Medicine

## 2015-12-05 ENCOUNTER — Encounter (HOSPITAL_COMMUNITY): Payer: Self-pay | Admitting: *Deleted

## 2015-12-05 DIAGNOSIS — Y9389 Activity, other specified: Secondary | ICD-10-CM | POA: Insufficient documentation

## 2015-12-05 DIAGNOSIS — Y92410 Unspecified street and highway as the place of occurrence of the external cause: Secondary | ICD-10-CM | POA: Diagnosis not present

## 2015-12-05 DIAGNOSIS — Y998 Other external cause status: Secondary | ICD-10-CM | POA: Diagnosis not present

## 2015-12-05 DIAGNOSIS — S29001A Unspecified injury of muscle and tendon of front wall of thorax, initial encounter: Secondary | ICD-10-CM | POA: Diagnosis present

## 2015-12-05 DIAGNOSIS — S20212A Contusion of left front wall of thorax, initial encounter: Secondary | ICD-10-CM | POA: Diagnosis not present
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
## 2015-12-05 NOTE — Discharge Instructions (Signed)
Do not hesitate to return to the emergency room for any new, worsening or concerning symptoms.  Please obtain primary care using resource guide below. Let them know that you were seen in the emergency room and that they will need to obtain records for further outpatient management.  For pain control please take ibuprofen (also known as Motrin or Advil) 880m (this is normally 4 over the counter pills) 3 times a day  for 5 days. Take with food to minimize stomach irritation.   Contusin (Contusion) Una contusin es un hematoma profundo. Las contusiones ocurren cuando una lesin causa un sangrado debajo de la piel. Los sntomas de hematoma incluyen dolor, hinchazn y cambio de color en la piel. La piel puede ponerse azul, morada o aSoddy-Daisy CUIDADOS EN EL HOGAR   Mantenga la zona de la lesin en reposo.  Aplique hielo sobre la zona lesionada, si se lo indican.  Ponga el hielo en una bolsa plstica.  Coloque una toalla entre la piel y la bolsa de hielo.  Coloque el hielo durante 270mutos, 2 a 3veces por daTraining and development officer Si se lo indican, ejerza una presin suave (compresin) en la zona de la lesin con una venda elstica. Asegrese de que la venda no est muMadagascarRetrela y vuelva a coLawyeromo se lo haya indicado el mdico.  Cuando est sentado o acostado, eleve la zona de la lesin por encima del nivel del corazn, si es posible.  Tome los medicamentos de venta libre y los recetados solamente como se lo haya indicado el mdico. SOLICITE AYUDA SI:  Los sntomas no mejoran despus de varios das de trClancy Los sntomas empeoran.  Tiene dificultad para mover la zona de la lesin. SOLICITE AYUDA DE INMEDIATO SI:   Siente mucho dolor.  Pierde la sensibilidad (adormecimiento) en una mano o un pie.  La mano o el pie estn plidos o fros.   Esta informacin no tiene coMarine scientistl consejo del mdico. Asegrese de hacerle al mdico cualquier pregunta que tenga.     Document Released: 07/13/2011 Document Revised: 04/14/2015 Elsevier Interactive Patient Education 2016 [place] United Ways 211 is a great source of information about community services available.  Access by dialing 2-1-1 from anywhere in [place] by website -  wwCustodianSupply.fi  Other Local Resources (Updated 08/2015)  FiOto  Phone Number and Address  [facility]edical care - 1st and 3rd Saturday of every month  Must not qualify for public or private insurance and must have limited income [phone]. WaKinderhookNCEdwardsvilleChild care  Emergency assistance for housing and utLincoln National CorporationMedicaid [phone] N. [place] [id number] AlPlastic And Reconstructive Surgeonsepartment  Low-cost medical care for children, communicable diseases, sexually-transmitted diseases, immunizations, maternity care, womens health and family planning [phone]. GrCoto LaurelNC [id number] In[facility]   Medication assistance for AlMilford Regional Medical Centeresidents  Must meet income requirements [phone]   CaGouldingChild care  Emergency assistance for housing and utLincoln National CorporationMedicaid [phone] [place] Stre[place] Park[place] [id number]   Low-cost medical care,   Monday through Friday, 9 am to 6 pm.  Accepts Medicare/Medicaid, and self-pay [phone] [address] [place], [place] [address]  [facility] for [place] care - Monday through Friday, 8:30 am - 5:30 pm  Accepts Medicaid and self-pay [phone] [address], [place], [place] [address]   [facility]  Primary medical care, including for those with sickle cell disease  Accepts Medicare, Medicaid, insurance and self-pay [phone] [address] [place], [place]  [facility]   Primary medical care  Accepts Medicare, [place], insurance and self-pay [phone] [address] [place], [place] [address]   [facility] Department of Social Services  Child care  Emergency assistance for housing and [employer]  Medicaid [phone] [address] [place], [place] [address]  [place] Department of Health and [employer]  Child care  Emergency assistance for housing and [employer]  Medicaid [phone] [place], [place] [address]   [facility] Medication Assistance Program  Medication assistance for [facility] residents with no insurance only  Must have a primary care doctor [phone] [address], [place], [place]  [facility]   Primary medical care  [place], [place], insurance  [phone] [address] [place], [place]  [facility]ist   Medication assistance [phone]  [facility]   Primary medical care  Accepts Medicare, [place], insurance and self-pay [phone] [address] [place], [place] [address]  [facility]   Primary medical care  Accepts Medicare, [place], insurance and self-pay [phone] [address], [place] [address]  [facility]  For [place] residents between the ages of 41 and 64 who do not have any form of health insurance, Medicare, [place], or [place] benefits.  Services are provided free of charge to uninsured patients who fall within federal poverty guidelines.    Hours: Tuesdays and Thursdays, 4:15 - 8 pm [phone] [address], [place], [place] [address]  [facility]     Primary medical care  Dental care  Nutritional counseling  Pharmacy  Accepts Medicaid, Medicare, most insurance.  Fees are adjusted based on ability to pay.   [place], [place] [address] [place], [place], [facility], [place], [facility] [place], [place]  [facility]  Womens health and family planning [phone] [place]. [place], [place] care  Emergency assistance for housing and [employer]  Medicaid [phone] [address] [place], [place] [address]   [facility]    Ages 50 and older  Hours: Mondays and Thursdays, 7:00 am - 9:00 am Patients are seen on a first come, first served basis. [phone], ext. [place], [place]  Child care  Emergency assistance for housing and [employer]  Medicaid [phone] [address], [place] [address]  [facility] [place]  Medication assistance  Rental assistance  Food pantry  Medication assistance  Housing assistance  Emergency food distribution  Utility assistance [place], [place]  [address]. [place],  [place] [address] Hours: Tuesdays and Thursdays from [place] noon by appointment only  [place], [place] [address]  [facility] [place] private insurance, [place], and [place].  Payment is based on a sliding scale for those without insurance.  Hours: Mondays, Tuesdays and  Thursdays, 8:30 am - 5:30 pm.   [phone] [place], [place]  [facility] - Family Medicine at [facility], [place], and [place].  Payment is based on a sliding scale for those without insurance. [phone] [address] [place], [place]  [facility] - Pediatrics at [address]. Scientist, research (medical), [employer], and [place].  Payment is based on a sliding scale for those without insurance [phone] [address], [employer], [place]  [facility] - Pediatrics at [employer], [place], and [place].  Payment is based on a sliding scale for those without insurance. [phone] [place], [place]  [facility] - Pediatrics at [facility], [place], and [place].  Payment is based on a sliding scale for those without insurance. [phone], ext. [phone] [place], [place].    [place] care.  Accepts Medicaid and self-pay. [place], [place]
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
## 2015-12-05 NOTE — ED Notes (Signed)
Patient was alert, oriented and stable upon discharge. RN went over AVS and patient had no further questions. Interpreter was used for discharge.  

## 2015-12-17 MED FILL — busPIRone HCL 10 MG TABS: 10 | 30 days supply | Qty: 60 | Fill #2

## 2015-12-17 MED FILL — DULoxetine HCL 60 MG CPEP: 60 | 30 days supply | Qty: 30 | Fill #2

## 2016-02-03 MED FILL — busPIRone HCL 10 MG TABS: 10 | 30 days supply | Qty: 60 | Fill #3

## 2016-02-03 MED FILL — DULoxetine HCL 60 MG CPEP: 60 | 30 days supply | Qty: 30 | Fill #3

## 2016-05-05 MED FILL — DULoxetine HCL 60 MG CPEP: 60 | 30 days supply | Qty: 30 | Fill #4

## 2016-05-05 MED FILL — busPIRone HCL 10 MG TABS: 10 | 30 days supply | Qty: 60 | Fill #4

## 2016-05-16 IMAGING — US US OB TRANSVAGINAL
1 series · 14 of 28 positions shown · non-contrast
Comparison: None.

CLINICAL DATA: First trimester pregnancy with uncertain fetal
viability.

EXAM:
OBSTETRIC <14 WK US AND TRANSVAGINAL OB US
TECHNIQUE: Both transabdominal and transvaginal ultrasound examinations were
performed for complete evaluation of the gestation as well as the
maternal uterus, adnexal regions, and pelvic cul-de-sac.
Transvaginal technique was performed to assess early pregnancy.

[Series 1: us ob comp less 14 wks · 14 of 73 slices shown]
[im 3/73]
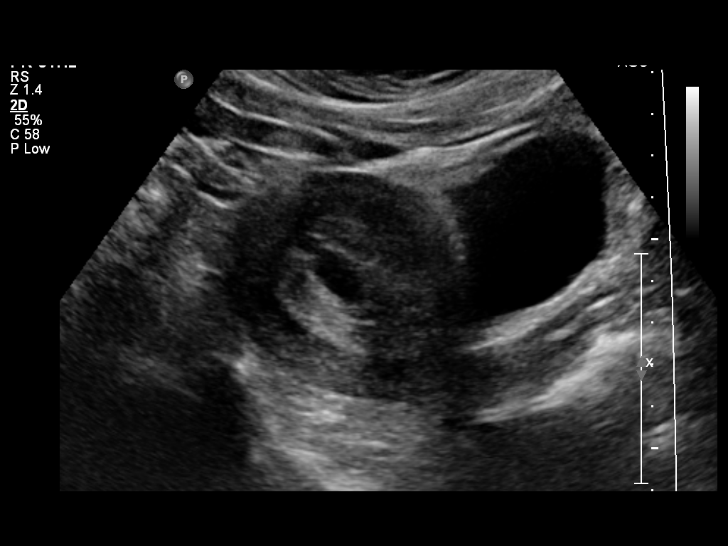
[im 9/73]
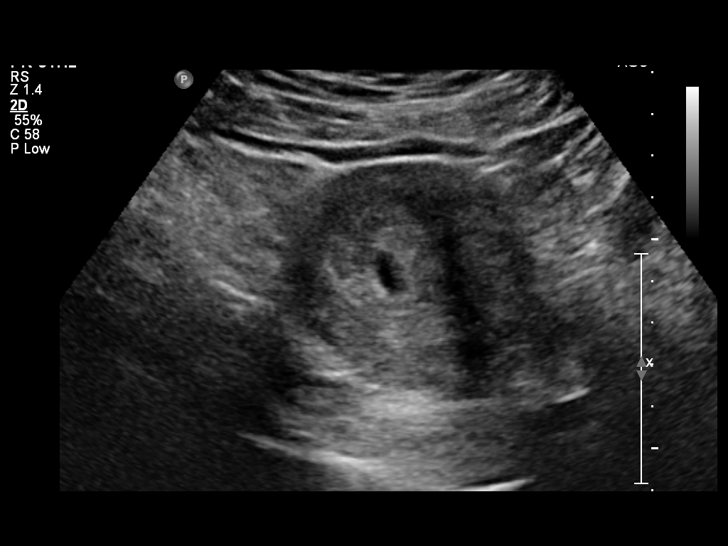
[im 14/73]
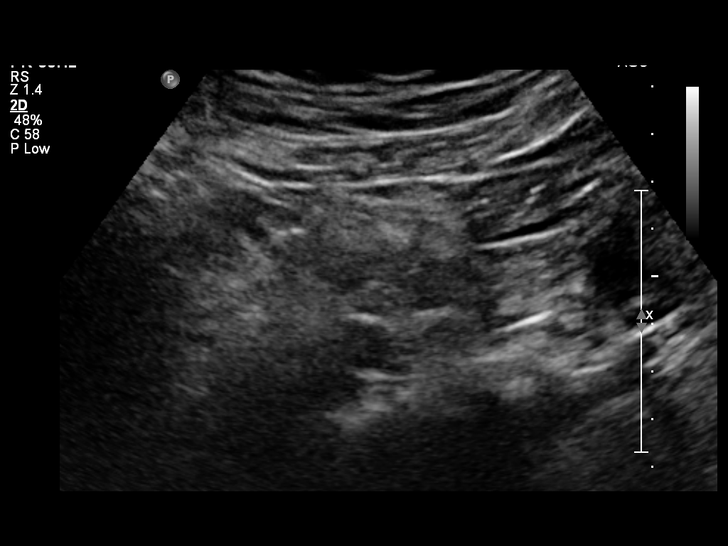
[im 19/73]
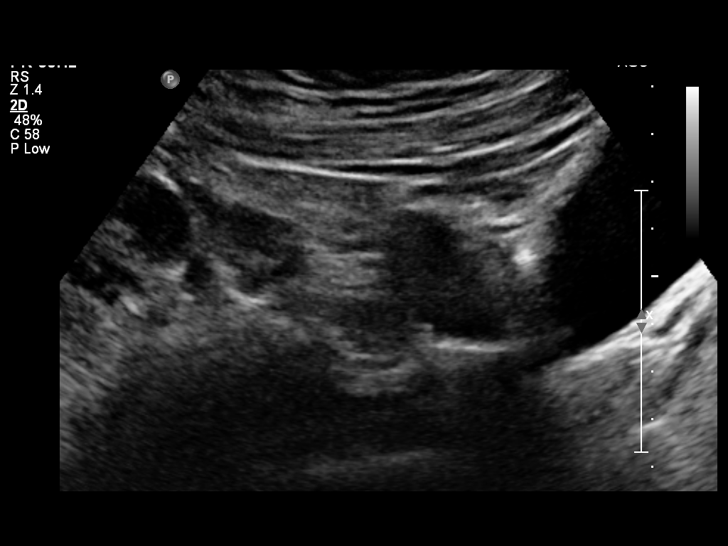
[im 25/73]
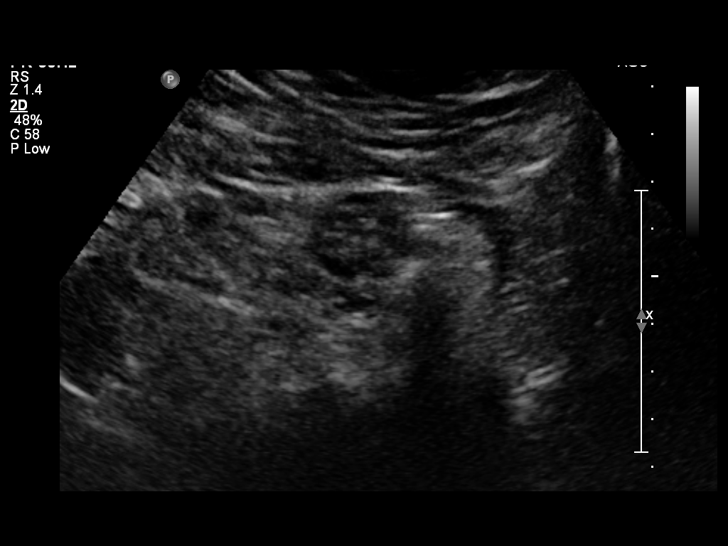
[im 30/73]
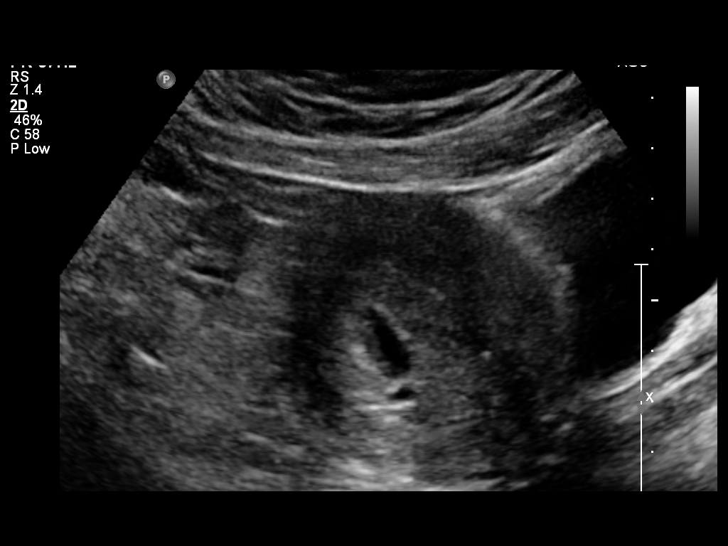
[im 35/73]
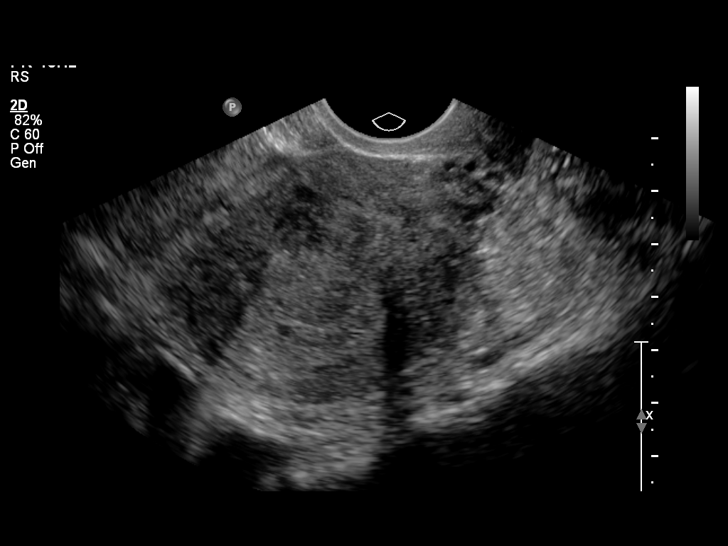
[im 41/73]
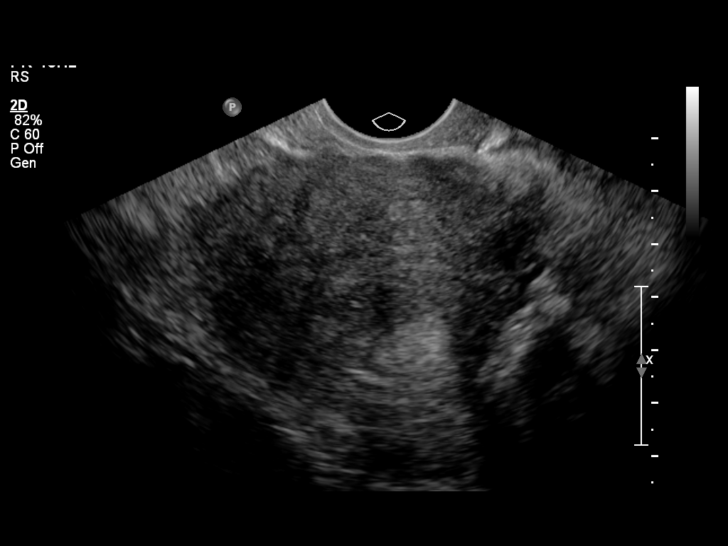
[im 46/73]
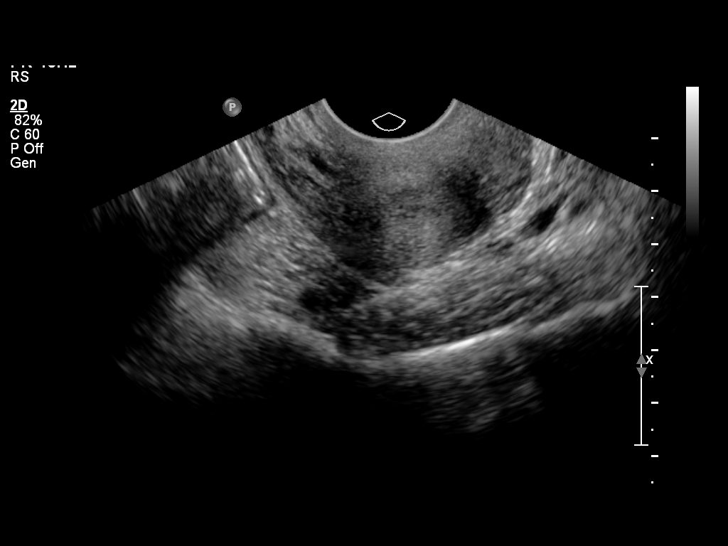
[im 51/73]
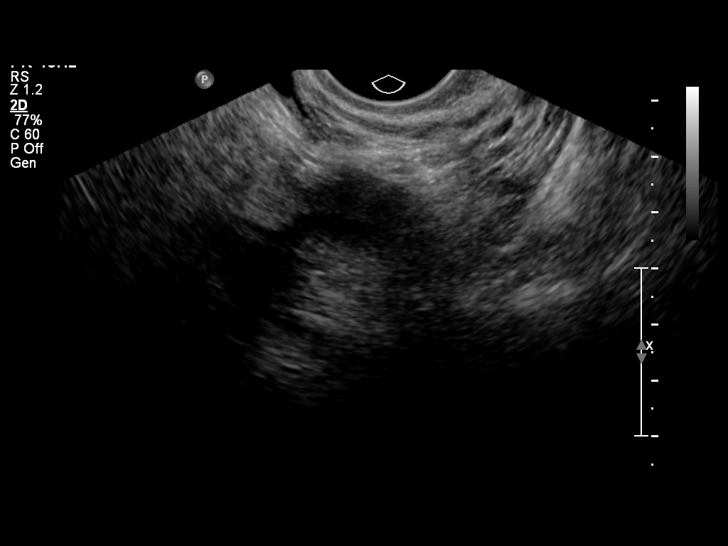
[im 57/73]
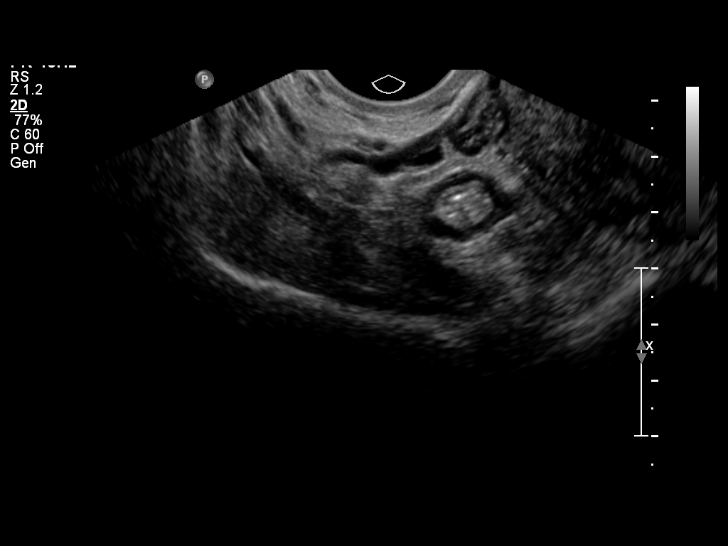
[im 62/73]
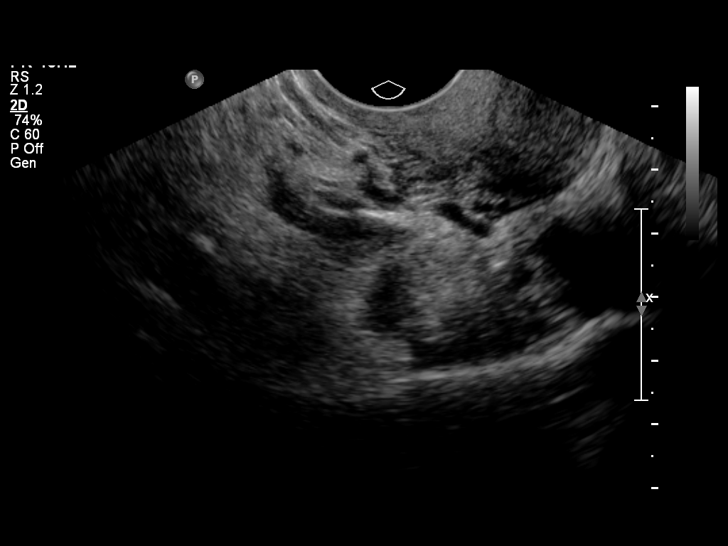
[im 67/73]
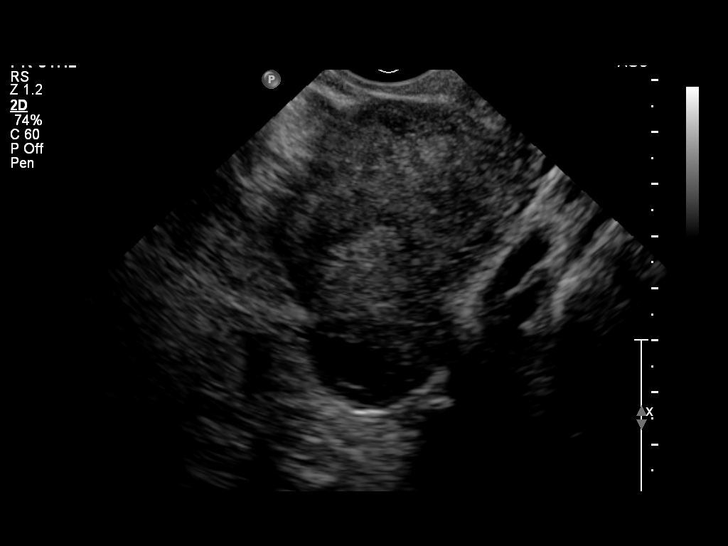
[im 73/73]
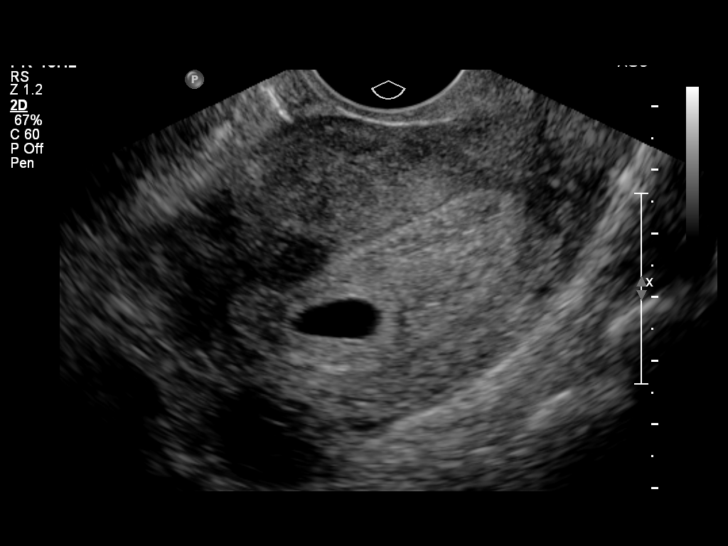

[14 of 28 positions shown; findings below may reference images not displayed]

FINDINGS: Intrauterine gestational sac: Visualized/normal in shape.

Yolk sac:  None visualized

Embryo:  None visualized

MSD:  11  mm   5 w   5  d

Maternal uterus/adnexae: Both ovaries are normal in appearance. No
mass or free fluid visualized.
IMPRESSION: Single intrauterine gestational sac measuring 5 weeks 5 days by mean
sac diameter. Recommend continued followup of quantitative B-HCG
levels, with follow-up US no earlier than 7-10 days to assess
viability if warranted clinically.

No significant maternal uterine or adnexal abnormality identified.

## 2016-06-10 IMAGING — US US OB TRANSVAGINAL
1 series · 14 of 28 positions shown · non-contrast
Comparison: 08/06/2014 and 07/23/2014

CLINICAL DATA: Followup for probable missed AB. 9 weeks 6 days by
LMP. 9 weeks 0 days by first ultrasound.

EXAM:
TRANSVAGINAL OB ULTRASOUND
TECHNIQUE: Transvaginal ultrasound was performed for complete evaluation of the
gestation as well as the maternal uterus, adnexal regions, and
pelvic cul-de-sac.

[Series 1: us ob follow up · 30 acquisitions, 14 frames shown]
[im 2/30]
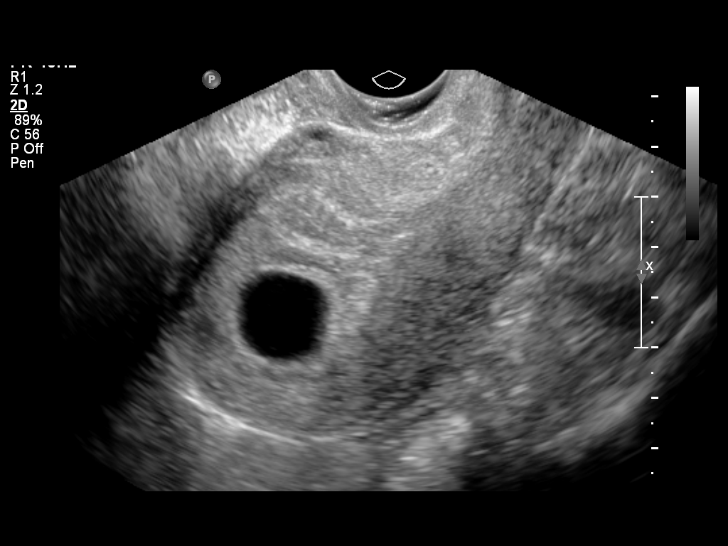
[im 4/30]
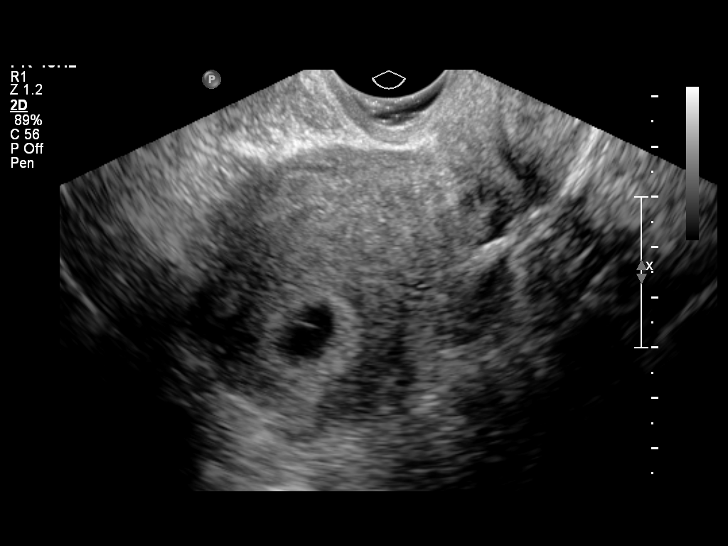
[im 6/30]
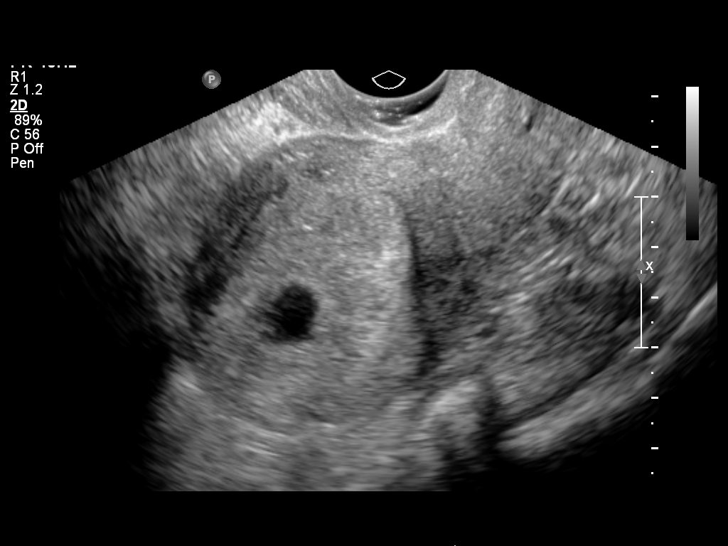
[im 8/30]
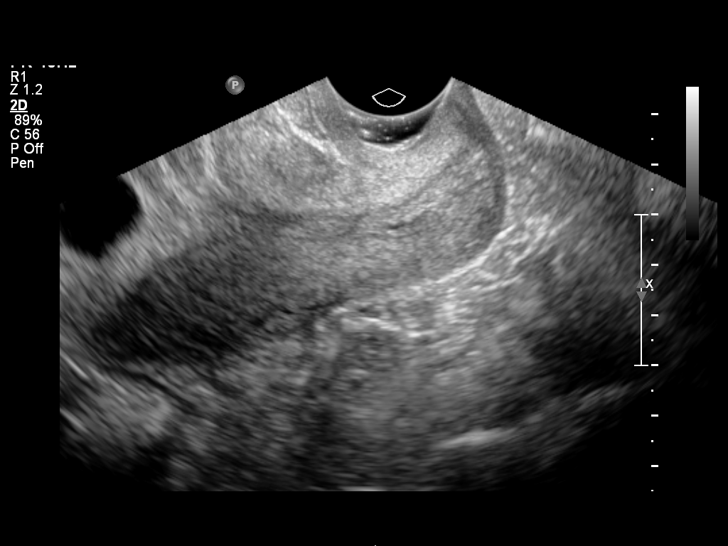
[im 10/30]
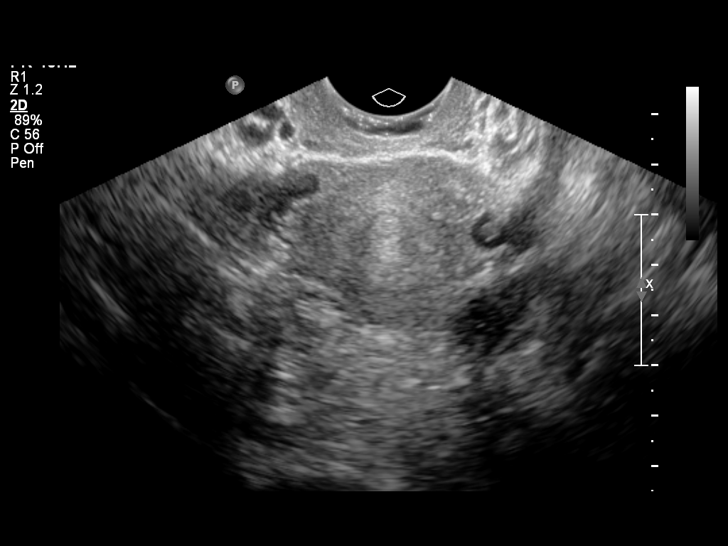
[im 12/30]
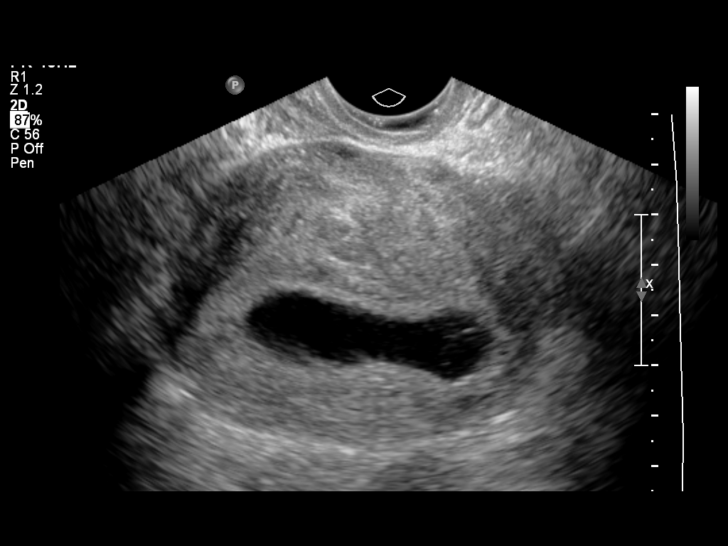
[im 14/30]
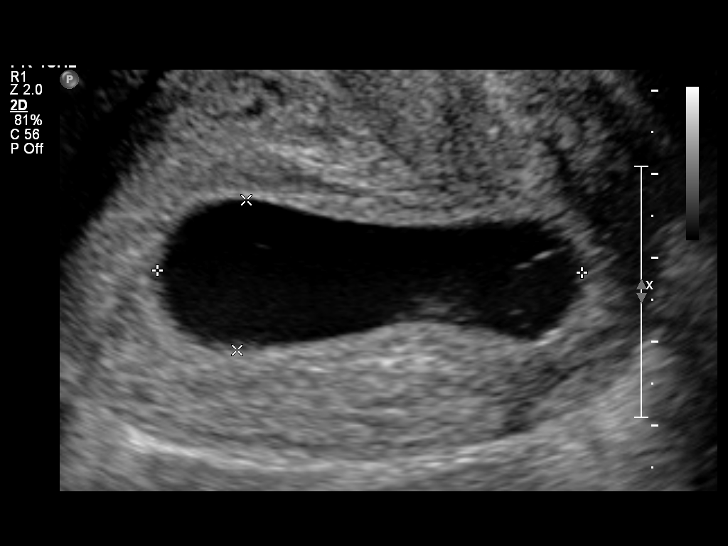
[im 17/30]
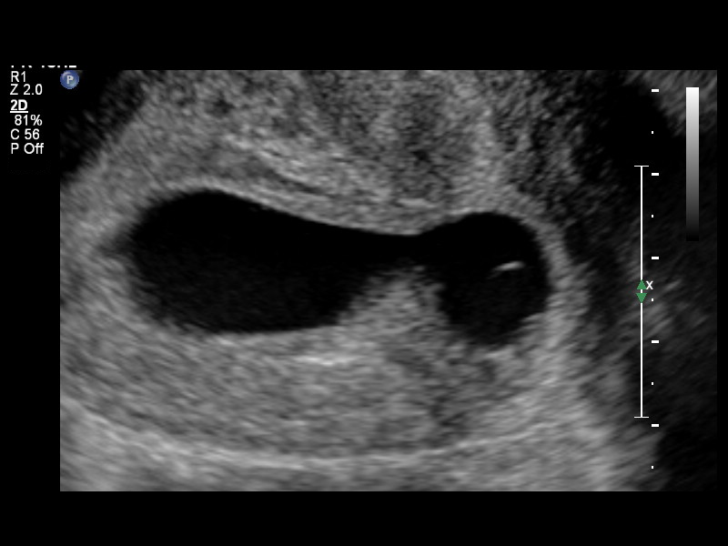
[im 19/30]
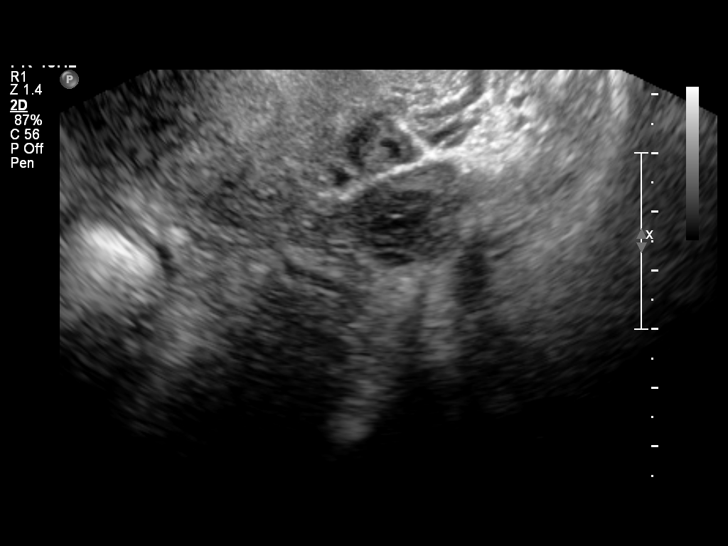
[im 21/30]
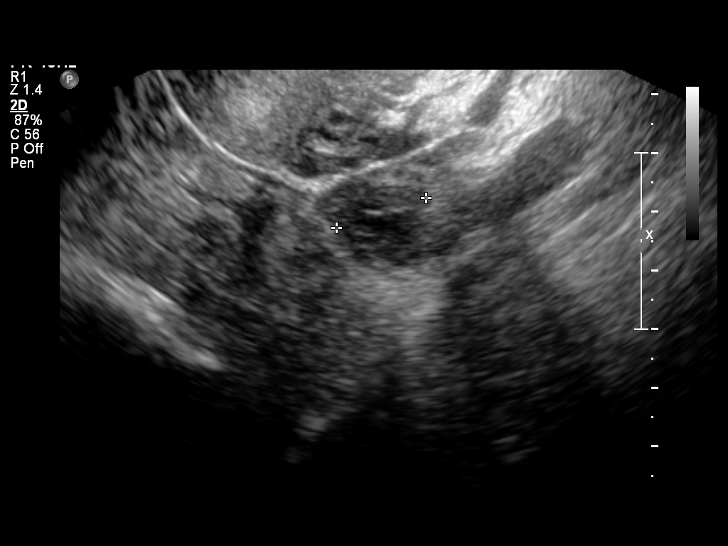
[im 23/30]
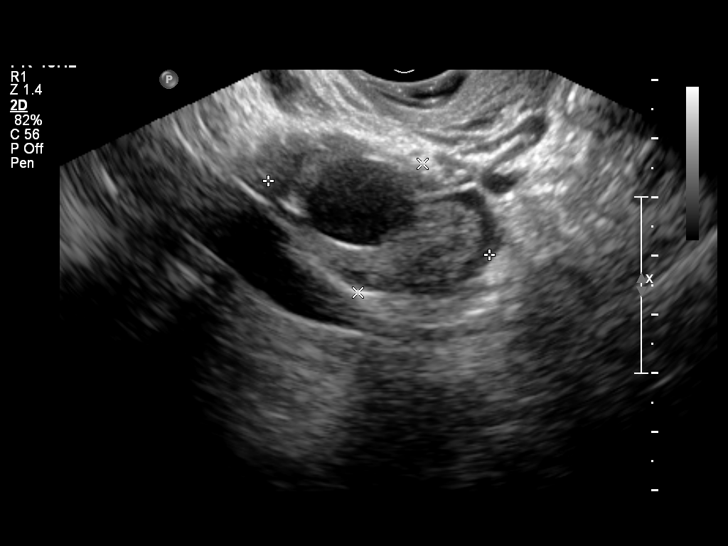
[im 25/30]
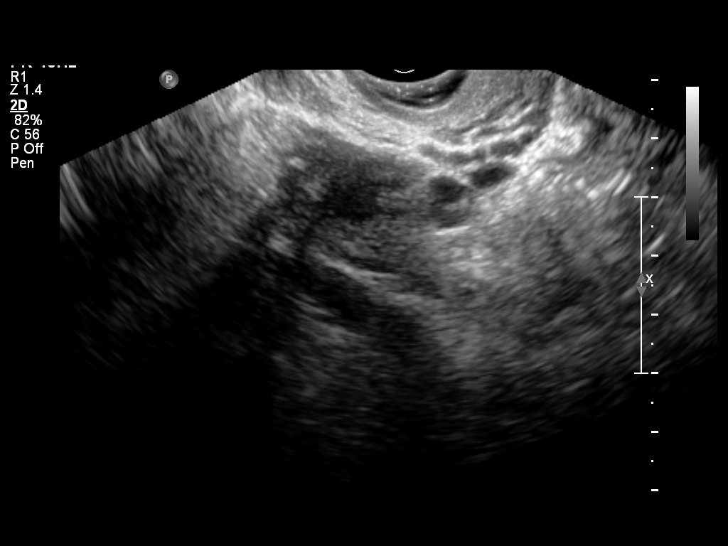
[im 27/30]
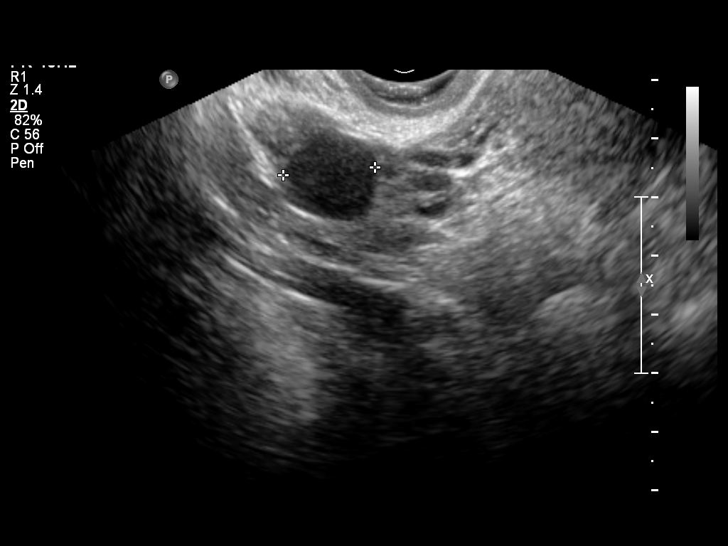
[im 30/30]
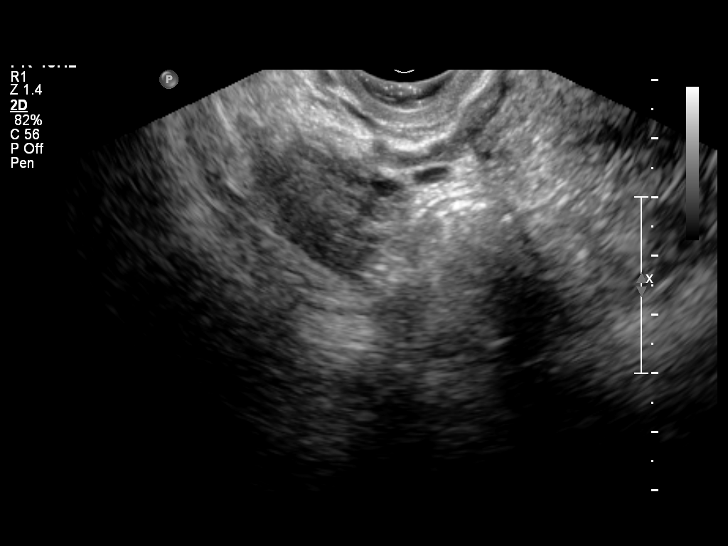

[14 of 28 positions shown; findings below may reference images not displayed]

FINDINGS: Intrauterine gestational sac: Present

Yolk sac:  Not seen

Embryo:  Not seen

MSD: 29.8  mm   8 w   2  d

Maternal uterus/adnexae: No subchorionic hemorrhage. The ovaries
have a normal appearance. Right corpus luteum cyst is 1.6 x 1.5 x
1.6 cm. No free pelvic fluid.
IMPRESSION: Anembryonic gestation. Given the lack of embryo more than 2 weeks
after a presence of gestational sac without yolk sac, the findings
meet definitive criteria for failed pregnancy. This follows SRU
consensus guidelines: Diagnostic Criteria for Nonviable Pregnancy
Early in the First Trimester. N Engl J Med 0155;[DATE].

## 2016-06-12 ENCOUNTER — Ambulatory Visit (INDEPENDENT_AMBULATORY_CARE_PROVIDER_SITE_OTHER): Payer: Self-pay | Admitting: Internal Medicine

## 2016-06-12 ENCOUNTER — Encounter: Payer: Self-pay | Admitting: Internal Medicine

## 2016-06-12 VITALS — BP 150/90 | HR 72 | Ht 65.0 in

## 2016-06-12 DIAGNOSIS — R112 Nausea with vomiting, unspecified: Secondary | ICD-10-CM

## 2016-06-12 DIAGNOSIS — K582 Mixed irritable bowel syndrome: Secondary | ICD-10-CM

## 2016-06-12 DIAGNOSIS — R1012 Left upper quadrant pain: Secondary | ICD-10-CM

## 2016-06-12 NOTE — Progress Notes (Signed)
   Subjective:    Patient ID: Michele Vincent, female    DOB: 08/20/1970, 45 y.o.   MRN: [id number]  HPI Michele Vincent is a 45 year old female with IBS, chronic abdominal pain, anxiety and depression who is here for follow-up. She is here today with a medical Spanish interpreter. She was last seen in February 2017. He has continued on Cymbalta 60 mg daily and buspirone 5 mg twice daily. She reports that her symptoms come and go but are specifically and reliably worse with stress. She reports that about 3 months ago she had one week of severe nausea and vomiting. This has largely improved though she still has intermittent nausea with vomiting. When present this is postprandial. She reports occasional constipation followed by loose stools. She will have weeks of normal stools. It does not seem to relate to her menstrual cycle, these have been regular. She still has her chronic left-sided abdominal discomfort and intermittent bloating. She's had no blood in her stool or melena. No fever or chills. No weight loss. She doesn't exercise routinely  She has had prior endoscopy and colonoscopy performed in September 2015. These were largely unremarkable though there was some mild chronic gastritis in her stomach. She had diverticulosis from ascending colon to sigmoid colon.   Review of Systems As per history of present illness, otherwise negative  Current Medications, Allergies, Past Medical History, Past Surgical History, Family History and Social History were reviewed in [facility] electronic medical record.     Objective:   Physical Exam BP (!) 150/90   Pulse 72   Ht 5\' 5"  (1.651 m)   LMP 06/04/2016  Constitutional: Well-developed and well-nourished. No distress. HEENT: Normocephalic and atraumatic. Oropharynx is clear and moist. No oropharyngeal exudate. Conjunctivae are normal.  No scleral icterus. Neck: Neck supple. Trachea midline. Cardiovascular: Normal rate, regular rhythm and  intact distal pulses. No M/R/G Pulmonary/chest: Effort normal and breath sounds normal. No wheezing, rales or rhonchi. Abdominal: Soft, Left upper quadrant discomfort without rebound or guarding, nondistended. Bowel sounds active throughout. There are no masses palpable. No hepatosplenomegaly. Extremities: no clubbing, cyanosis, or edema Lymphadenopathy: No cervical adenopathy noted. Neurological: Alert and oriented to person place and time. Skin: Skin is warm and dry. No rashes noted. Psychiatric: Normal mood and affect. Behavior is normal.     Assessment & Plan:  45 year old female with IBS, chronic abdominal pain, anxiety and depression who is here for follow-up.  1. IBS with chronic abdominal pain/intermittent alternating diarrhea and constipation -- symptoms are larger that of IBS. I recommended abdominal ultrasound to exclude gallstones given her more recent nausea with vomiting. I'm going to increase her buspirone to 10 mg twice daily and continue Cymbalta 60 mg daily. Zofran 4 mg every 6-8 hours as needed for nausea. We discussed that this can cause constipation. Colace 100 200 mg at bedtime as needed for constipation. Large portion of her symptoms are stress related which would fit more with IBS. I encouraged her to exercise if possible on a regular basis with a goal of 30 minutes 3 days a week. Exercise will likely overall improve her IBS symptoms and stress. Return in 6 months, sooner if necessary  25 minutes spent with the patient today. Greater than 50% was spent in counseling and coordination of care with the patient

## 2016-06-12 NOTE — Patient Instructions (Addendum)
Continue cymbalta.  Increase your buspar to 10 mg twice daily  Please purchase the following medications over the counter and take as directed: Colace 200 mg every night as needed for constipation  We have sent the following medications to your pharmacy for you to pick up at your convenience: zofran  4 mg every 8 hours as needed for nausea  You [facility] of [facility]) on Thursday, 06/15/16 at 8:00 am. Please arrive 15 minutes prior to your appointment for registration. Make certain not to have anything to eat or drink 6 hours prior to your appointment. Should you need to reschedule your appointment, please contact radiology at [phone]. This test typically takes about 30 minutes to perform.  I

## 2016-06-15 ENCOUNTER — Ambulatory Visit (HOSPITAL_COMMUNITY)
Admission: RE | Admit: 2016-06-15 | Discharge: 2016-06-15 | Disposition: A | Discharge status: Home / Self Care | Payer: Self-pay | Source: Ambulatory Visit | Attending: Internal Medicine | Admitting: Internal Medicine

## 2016-06-15 ENCOUNTER — Telehealth: Payer: Self-pay | Admitting: Internal Medicine

## 2016-06-15 DIAGNOSIS — K8689 Other specified diseases of pancreas: Secondary | ICD-10-CM | POA: Insufficient documentation

## 2016-06-15 DIAGNOSIS — K760 Fatty (change of) liver, not elsewhere classified: Secondary | ICD-10-CM | POA: Insufficient documentation

## 2016-06-15 DIAGNOSIS — R112 Nausea with vomiting, unspecified: Secondary | ICD-10-CM | POA: Insufficient documentation

## 2016-06-15 DIAGNOSIS — R1012 Left upper quadrant pain: Secondary | ICD-10-CM | POA: Insufficient documentation

## 2016-06-15 DIAGNOSIS — K8020 Calculus of gallbladder without cholecystitis without obstruction: Secondary | ICD-10-CM | POA: Insufficient documentation

## 2016-06-15 MED ORDER — BUSPIRONE HCL 10 MG PO TABS
10.0000 mg | ORAL_TABLET | Freq: Two times a day (BID) | ORAL | 2 refills | Status: DC
Start: 2016-06-15 — End: 2016-08-29

## 2016-06-15 MED ORDER — DULOXETINE HCL 60 MG PO CPEP: 60.0000 mg | ORAL_CAPSULE | Freq: Every day | ORAL | 2 refills | Status: DC

## 2016-06-15 MED FILL — DULoxetine HCL 60 MG CPEP: 60 | 30 days supply | Qty: 30 | Fill #0

## 2016-06-15 MED FILL — ?BUSPIRONE HCL 10 MG TABLET: 10 | 30 days supply | Qty: 60 | Fill #0

## 2016-06-15 NOTE — Telephone Encounter (Signed)
Rx sent 

## 2016-06-16 ENCOUNTER — Other Ambulatory Visit: Payer: Self-pay

## 2016-06-16 DIAGNOSIS — K8020 Calculus of gallbladder without cholecystitis without obstruction: Secondary | ICD-10-CM

## 2016-06-22 ENCOUNTER — Other Ambulatory Visit: Payer: Self-pay | Admitting: Obstetrics and Gynecology

## 2016-06-22 DIAGNOSIS — Z1231 Encounter for screening mammogram for malignant neoplasm of breast: Secondary | ICD-10-CM

## 2016-06-23 ENCOUNTER — Ambulatory Visit: Payer: Self-pay | Attending: Internal Medicine

## 2016-06-24 ENCOUNTER — Encounter: Payer: Self-pay | Admitting: Internal Medicine

## 2016-07-20 ENCOUNTER — Ambulatory Visit (HOSPITAL_COMMUNITY)
Admission: RE | Admit: 2016-07-20 | Discharge: 2016-07-20 | Disposition: A | Discharge status: Home / Self Care | Payer: Self-pay | Source: Ambulatory Visit | Attending: Obstetrics and Gynecology | Admitting: Obstetrics and Gynecology

## 2016-07-20 ENCOUNTER — Ambulatory Visit
Admission: RE | Admit: 2016-07-20 | Discharge: 2016-07-20 | Disposition: A | Discharge status: Home / Self Care | Payer: No Typology Code available for payment source | Source: Ambulatory Visit | Attending: Obstetrics and Gynecology | Admitting: Obstetrics and Gynecology

## 2016-07-20 ENCOUNTER — Encounter (HOSPITAL_COMMUNITY): Payer: Self-pay

## 2016-07-20 VITALS — BP 152/90 | Temp 98.4°F | Ht 61.0 in | Wt 177.4 lb

## 2016-07-20 DIAGNOSIS — Z1239 Encounter for other screening for malignant neoplasm of breast: Secondary | ICD-10-CM

## 2016-07-20 DIAGNOSIS — Z1231 Encounter for screening mammogram for malignant neoplasm of breast: Secondary | ICD-10-CM

## 2016-07-20 MED FILL — DULoxetine HCL 60 MG CPEP: 60 | 30 days supply | Qty: 30 | Fill #1

## 2016-07-20 MED FILL — busPIRone HCL 10 MG TABS: 10 | 30 days supply | Qty: 60 | Fill #1

## 2016-07-20 NOTE — Patient Instructions (Signed)
Explained breast self awareness to [facility] Michele Vincent. Patient did not need a Pap smear today due to last Pap smear and HPV typing was 10/26/2014. Let her know BCCCP will cover Pap smears and HPV typing every 5 years unless has a history of abnormal Pap smears. [facility]. Appointment scheduled for Thursday, July 20, 2016 at 1130. Let patient know the Breast Center will follow up with her within the next couple weeks with results of mammogram by phone. Michele Verl BangsOseguera Vincent verbalized understanding.  Theda Payer, Kathaleen Maserhristine Poll, RN 12:44 PM

## 2016-07-20 NOTE — Progress Notes (Signed)
No complaints today.   Pap Smear:  Pap smear not completed today. Last [facility] and normal with negative HPV. Per patient has no history of an abnormal Pap smear. Last Pap smear result is in EPIC.  Physical exam: Breasts Breasts symmetrical. No skin abnormalities bilateral breasts. No nipple retraction bilateral breasts. No nipple discharge bilateral breasts. No lymphadenopathy. No lumps palpated bilateral breasts. No complaints of pain or tenderness on exam. [facility] mamm[facility]. Appointment scheduled for Thursday, July 20, 2016 at 1130.        Pelvic/Bimanual No Pap smear completed today since last Pap smear and HPV typing was 10/26/2014. Pap smear not indicated per BCCCP guidelines.   Smoking History: Patient has never smoked.  Patient Navigation: Patient education provided. [facility]. Spanish interpreter provided.  Used Spanish interpreter [employer]manda Benbassat from [facility] [place].

## 2016-07-21 ENCOUNTER — Encounter (HOSPITAL_COMMUNITY): Payer: Self-pay | Admitting: *Deleted

## 2016-08-10 ENCOUNTER — Telehealth: Payer: Self-pay | Admitting: Internal Medicine

## 2016-08-10 NOTE — Telephone Encounter (Signed)
I called and left patient a message to inform her that we mailed her [employer] and that it was returned to us. Asked patient to come by the office to pick it up and confirm address.

## 2016-08-29 ENCOUNTER — Encounter: Payer: Self-pay | Admitting: Family Medicine

## 2016-08-29 ENCOUNTER — Ambulatory Visit: Payer: Self-pay | Attending: Family Medicine | Admitting: Family Medicine

## 2016-08-29 VITALS — BP 132/84 | HR 103 | Temp 99.1°F | Resp 18 | Ht 62.0 in | Wt 175.4 lb

## 2016-08-29 DIAGNOSIS — Z79899 Other long term (current) drug therapy: Secondary | ICD-10-CM | POA: Insufficient documentation

## 2016-08-29 DIAGNOSIS — K580 Irritable bowel syndrome with diarrhea: Secondary | ICD-10-CM | POA: Insufficient documentation

## 2016-08-29 DIAGNOSIS — F32A Depression, unspecified: Secondary | ICD-10-CM

## 2016-08-29 DIAGNOSIS — Z87898 Personal history of other specified conditions: Secondary | ICD-10-CM | POA: Insufficient documentation

## 2016-08-29 DIAGNOSIS — Z8719 Personal history of other diseases of the digestive system: Secondary | ICD-10-CM

## 2016-08-29 DIAGNOSIS — F329 Major depressive disorder, single episode, unspecified: Secondary | ICD-10-CM | POA: Insufficient documentation

## 2016-08-29 DIAGNOSIS — F419 Anxiety disorder, unspecified: Secondary | ICD-10-CM

## 2016-08-29 DIAGNOSIS — K589 Irritable bowel syndrome without diarrhea: Secondary | ICD-10-CM

## 2016-08-29 LAB — CBC WITH DIFFERENTIAL/PLATELET
BASOS PCT: 0 %
Basophils Absolute: 0 cells/uL (ref 0–200)
EOS ABS: 83 {cells}/uL (ref 15–500)
Eosinophils Relative: 1 %
HCT: 39.7 % (ref 35.0–45.0)
Hemoglobin: 13 g/dL (ref 11.7–15.5)
LYMPHS PCT: 27 %
Lymphs Abs: 2241 cells/uL (ref 850–3900)
MCH: 29 pg (ref 27.0–33.0)
MCHC: 32.7 g/dL (ref 32.0–36.0)
MCV: 88.4 fL (ref 80.0–100.0)
MONO ABS: 913 {cells}/uL (ref 200–950)
MPV: 9.6 fL (ref 7.5–12.5)
Monocytes Relative: 11 %
Neutro Abs: 5063 cells/uL (ref 1500–7800)
Neutrophils Relative %: 61 %
PLATELETS: 223 10*3/uL (ref 140–400)
RBC: 4.49 MIL/uL (ref 3.80–5.10)
RDW: 16.6 % — ABNORMAL HIGH (ref 11.0–15.0)
WBC: 8.3 10*3/uL (ref 3.8–10.8)

## 2016-08-29 MED ORDER — DOCUSATE SODIUM 100 MG PO CAPS: 200.0000 mg | ORAL_CAPSULE | Freq: Every day | ORAL | 0 refills | Status: DC | PRN

## 2016-08-29 MED ORDER — BUSPIRONE HCL 10 MG PO TABS: 10.0000 mg | ORAL_TABLET | Freq: Two times a day (BID) | ORAL | 3 refills | Status: DC

## 2016-08-29 MED ORDER — DULOXETINE HCL 60 MG PO CPEP: 60.0000 mg | ORAL_CAPSULE | Freq: Every day | ORAL | 2 refills | Status: DC

## 2016-08-29 MED FILL — busPIRone HCL 10 MG TABS: 10 | 30 days supply | Qty: 60 | Fill #0

## 2016-08-29 MED FILL — DULoxetine HCL 60 MG CPEP: 60 | 30 days supply | Qty: 30 | Fill #0

## 2016-08-29 NOTE — Patient Instructions (Addendum)
Follow up with gastroenterologist.  Colelitiasis (Cholelithiasis) La colelitiasis (tambin llamada clculos en la vescula) es una enfermedad de la vescula. La vescula es un rgano pequeo que ayuda a digerir las Sheridangrasas. Los sntomas de clculos en la vescula son:  Caleen EssexGanas de vomitar (nuseas).  Devolver la comida (vomitar).  Dolor en el vientre.  Piel amarilla (ictericia)  Dolor sbito. Podr sentir Chief Technology Officerel dolor durante algunos minutos o unas horas.  Grant RutsFiebre.  Dolor a Insurance claims handlerla palpacin. CUIDADOS EN EL HOGAR  Tome slo los medicamentos segn le haya indicado el mdico.  Siga una dieta baja en grasas hasta que vuelva a ver al mdico nuevamente. Consumir grasas puede Programmer, multimediacausar dolor.  Concurra a las consultas de control con el mdico, segn las indicaciones. Los ataques generalmente ocurren repetidas veces. Generalmente se requiere de Clorox Companyuna ciruga como tratamiento permanente. SOLICITE AYUDA DE INMEDIATO SI:  El dolor empeora.  El dolor no se alivia con los United Parcelmedicamentos.  Tiene fiebre o sntomas que persisten durante ms de 2-3 das.  Tiene fiebre y los sntomas empeoran repentinamente.  Siente nuseas y vomita. ASEGRESE DE QUE:  Comprende estas instrucciones.  Controlar su afeccin.  Recibir ayuda de inmediato si no mejora o si empeora. Esta informacin no tiene Theme park managercomo fin reemplazar el consejo del mdico. Asegrese de hacerle al mdico cualquier pregunta que tenga. Document Released: 10/20/2008 Document Revised: 03/26/2013 Document Reviewed: 01/15/2013 Elsevier Interactive Patient Education  2017 [employer]

## 2016-08-29 NOTE — Progress Notes (Signed)
Patient is here for estab care IBS DB  Patient have eaten today  Patient has not taking her meds today

## 2016-08-29 NOTE — Progress Notes (Signed)
Subjective:  Patient ID: Michele Vincent, female    DOB: November 11, 1970  Age: 46 y.o. MRN: [id number]  CC: Establish Care   HPI Michele Vincent presents to establish care for a GI referral. She reports previously seeing GI specialist in past. She reports a PMH of gallstones and IBS with varying episodes of diarrhea and constipation. Denies any fevers, N/V, abdominal pain or juandice. She reports being told she needs lab work for referral. She also reports a history of anxiety and depression which she is taking medications for. She denies any SI/HI.   Outpatient Medications Prior to Visit  Medication Sig Dispense Refill  . busPIRone (BUSPAR) 10 MG tablet Take 1 tablet (10 mg total) by mouth 2 (two) times daily. 60 tablet 2  . DULoxetine (CYMBALTA) 60 MG capsule Take 1 capsule (60 mg total) by mouth daily. 30 capsule 2   No facility-administered medications prior to visit.     ROS Review of Systems  Respiratory: Negative.   Cardiovascular: Negative.   Gastrointestinal: Negative.   Psychiatric/Behavioral: Negative for sleep disturbance and suicidal ideas. Dysphoric mood: history of depression. Nervous/anxious: history of anxiety.     Objective:  BP 132/84 (BP Location: Left Arm, Patient Position: Sitting, Cuff Size: Normal)   Pulse (!) 103   Temp 99.1 F (37.3 C) (Oral)   Resp 18   Ht 5\' 2"  (1.575 m)   Wt 175 lb 6.4 oz (79.6 kg)   LMP 08/20/2016   SpO2 100%   BMI 32.08 kg/m   BP/Weight 08/29/2016 07/20/2016 06/12/2016  Systolic BP 132 152 150  Diastolic BP 84 90 90  Wt. (Lbs) 175.4 177.4 -  BMI 32.08 33.52 -    Physical Exam  Constitutional: She is oriented to person, place, and time. She appears well-developed and well-nourished.  Eyes: No scleral icterus.  Cardiovascular: Normal rate, regular rhythm, normal heart sounds and intact distal pulses.   Pulmonary/Chest: Effort normal and breath sounds normal.  Abdominal: Soft. Bowel sounds are normal.  Neurological:  She is alert and oriented to person, place, and time.  Skin: Skin is warm and dry.  Psychiatric: She has a normal mood and affect. Her behavior is normal. Thought content normal.    Assessment & Plan:   Problem List Items Addressed This Visit      Digestive   IBS (irritable bowel syndrome)   -Per last GI visit it was recommended patient start colace. Patient reports being unware.   -Colace ordered per GI specialist recommendations.   Relevant Medications   docusate sodium (COLACE) 100 MG capsule     Other   Anxiety   Relevant Medications   busPIRone (BUSPAR) 10 MG tablet   DULoxetine (CYMBALTA) 60 MG capsule   Depressed   Relevant Medications   busPIRone (BUSPAR) 10 MG tablet   DULoxetine (CYMBALTA) 60 MG capsule    Other Visit Diagnoses    History of cholelithiasis    -  Primary   Relevant Orders   Hepatic Function Panel (Completed)   Lipid Panel (Completed)   CBC with Differential (Completed)   BASIC METABOLIC PANEL WITH GFR (Completed)   Ambulatory referral to Gastroenterology     Meds ordered this encounter  Medications  . docusate sodium (COLACE) 100 MG capsule    Sig: Take 2 capsules (200 mg total) by mouth daily as needed for mild constipation or moderate constipation.    Dispense:  10 capsule    Refill:  0    Order Specific Question:  Supervising Provider    Answer:   Quentin AngstJEGEDE, OLUGBEMIGA E [id number]]  . busPIRone (BUSPAR) 10 MG tablet    Sig: Take 1 tablet (10 mg total) by mouth 2 (two) times daily.    Dispense:  60 tablet    Refill:  3    Order Specific Question:   Supervising Provider    Answer:   Quentin AngstJEGEDE, OLUGBEMIGA E [id number]]  . DULoxetine (CYMBALTA) 60 MG capsule    Sig: Take 1 capsule (60 mg total) by mouth daily.    Dispense:  30 capsule    Refill:  2    Order Specific Question:   Supervising Provider    Answer:   Quentin AngstJEGEDE, OLUGBEMIGA E [id number]]    Follow-up: Return As needed.Lizbeth Bark.   Mandesia R Hairston FNP

## 2016-08-30 ENCOUNTER — Ambulatory Visit: Payer: No Typology Code available for payment source | Admitting: Family Medicine

## 2016-08-30 LAB — HEPATIC FUNCTION PANEL
ALBUMIN: 4.4 g/dL (ref 3.6–5.1)
ALT: 16 U/L (ref 6–29)
AST: 23 U/L (ref 10–30)
Alkaline Phosphatase: 62 U/L (ref 33–115)
BILIRUBIN TOTAL: 0.6 mg/dL (ref 0.2–1.2)
Bilirubin, Direct: 0.1 mg/dL (ref <=0.2)
Indirect Bilirubin: 0.5 mg/dL (ref 0.2–1.2)
Total Protein: 7.3 g/dL (ref 6.1–8.1)

## 2016-08-30 LAB — BASIC METABOLIC PANEL WITH GFR
BUN: 6 mg/dL — ABNORMAL LOW (ref 7–25)
CALCIUM: 9.2 mg/dL (ref 8.6–10.2)
CO2: 27 mmol/L (ref 20–31)
CREATININE: 0.81 mg/dL (ref 0.50–1.10)
Chloride: 104 mmol/L (ref 98–110)
GFR, EST NON AFRICAN AMERICAN: 89 mL/min (ref >=60)
Glucose, Bld: 93 mg/dL (ref 65–99)
Potassium: 4.3 mmol/L (ref 3.5–5.3)
SODIUM: 139 mmol/L (ref 135–146)

## 2016-08-30 LAB — LIPID PANEL
CHOL/HDL RATIO: 2.9 ratio (ref <5.0)
CHOLESTEROL: 218 mg/dL — AB (ref <200)
HDL: 75 mg/dL (ref >50)
LDL Cholesterol: 113 mg/dL — ABNORMAL HIGH (ref <100)
TRIGLYCERIDES: 148 mg/dL (ref <150)
VLDL: 30 mg/dL (ref <30)

## 2016-08-31 ENCOUNTER — Telehealth: Payer: Self-pay

## 2016-08-31 NOTE — Telephone Encounter (Signed)
Patient call back and CMA inform her about her results bring normal patient understood

## 2016-08-31 NOTE — Telephone Encounter (Signed)
-----   Message from Lizbeth BarkMandesia R Hairston, OregonFNP sent at 08/31/2016  9:37 AM EST ----- -Liver function is normal -Kidney function is normal -Start eating a diet low in saturated fat. Limit your intake of fried foods, red meats, and whole milk.  -Begin exercising at least 3-5 times per week for 30 minutes. -Labs normal

## 2016-08-31 NOTE — Telephone Encounter (Signed)
CMA cal to go over lab results no answer left a VM stating the reason of the call and to give us call

## 2016-08-31 NOTE — Telephone Encounter (Signed)
Pt. Returned call regarding results. Please f/u with pt.

## 2016-10-02 MED FILL — busPIRone HCL 10 MG TABS: 10 | 30 days supply | Qty: 60 | Fill #1

## 2016-10-02 MED FILL — DULoxetine HCL 60 MG CPEP: 60 | 30 days supply | Qty: 30 | Fill #1

## 2016-11-07 ENCOUNTER — Encounter: Payer: Self-pay | Admitting: *Deleted

## 2016-11-13 ENCOUNTER — Encounter: Payer: Self-pay | Admitting: Internal Medicine

## 2016-11-13 ENCOUNTER — Encounter (INDEPENDENT_AMBULATORY_CARE_PROVIDER_SITE_OTHER): Payer: Self-pay

## 2016-11-13 ENCOUNTER — Ambulatory Visit (INDEPENDENT_AMBULATORY_CARE_PROVIDER_SITE_OTHER): Payer: Self-pay | Admitting: Internal Medicine

## 2016-11-13 VITALS — BP 152/90 | HR 88 | Resp 18 | Ht 61.75 in | Wt 174.0 lb

## 2016-11-13 DIAGNOSIS — K582 Mixed irritable bowel syndrome: Secondary | ICD-10-CM

## 2016-11-13 MED FILL — busPIRone HCL 10 MG TABS: 10 | 30 days supply | Qty: 60 | Fill #2

## 2016-11-13 MED FILL — DULoxetine HCL 60 MG CPEP: 60 | 30 days supply | Qty: 30 | Fill #2

## 2016-11-13 NOTE — Progress Notes (Signed)
Subjective:    Patient ID: Michele Vincent, female    DOB: 01-31-1971, 46 y.o.   MRN: [id number]  HPI Michele Vincent is a 46 year old female with IBS, chronic abdominal pain, anxiety and depression who is here for follow-up. She is here today accompanied by a medical Spanish interpreter. She was last seen in November 2017. After her last visit we increased her buspirone to 10 mg twice a day and continued her Cymbalta 60 mg daily. At that time she was having alternating bowel habits as well as continued left-sided abdominal pain. We also performed an abdominal ultrasound to evaluate gallbladder. This showed a large gallstone but no evidence of cholecystitis. She was also having some nausea vomiting symptoms at that time  Today she reports she is feeling considerably better. 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  Her stress levels are under better control. Her abdominal pain is better. She still has intermittent left-sided abdominal pain but it is much less severe and less frequent. She is eating well. Less nausea and vomiting. Bowel habits have been more regular and more predictable. In her stool or melena. No fevers or chills. She denies right upper quadrant pain.   Review of Systems As per history of present illness, otherwise negative  Current Medications, Allergies, Past Medical History, Past Surgical History, Family History and Social History were reviewed in [employer] record.     Objective:   Physical Exam BP (!) 152/90   Pulse 88   Resp 18   Ht 5' 1.75" (1.568 m) Comment: done without shoes  Wt 174 lb (78.9 kg)   BMI 32.08 kg/m  Constitutional: Well-developed and well-nourished. No distress. HEENT: Normocephalic and atraumatic.   Conjunctivae are normal.  No scleral icterus. Neck: Neck supple. Trachea midline. Cardiovascular: Normal rate, regular rhythm and intact distal pulses. No  M/R/G Pulmonary/chest: Effort normal and breath sounds normal. No wheezing, rales or rhonchi. Abdominal: Soft, nontender, nondistended. Bowel sounds active throughout.  Extremities: no clubbing, cyanosis, or edema  Neurological: Alert and oriented to person place and time. Skin: Skin is warm and dry.  Psychiatric: Normal mood and affect. Behavior is normal.  ABDOMEN ULTRASOUND COMPLETE   COMPARISON:  Of the abdomen and pelvis CT scan of June 18, 2013   FINDINGS: Gallbladder: The gallbladder is adequately distended. There is a large echogenic shadowing stone measuring 2.2 cm in greatest dimension. There is no gallbladder wall thickening, pericholecystic fluid, or positive sonographic Murphy's sign.   Common bile duct: Diameter: 5.6 mm   Liver: The liver exhibits mildly increased echotexture diffusely. There is no focal mass nor ductal dilation.   IVC: No abnormality visualized.   Pancreas: Visualized portion unremarkable. The pancreatic duct is borderline dilated and measures 2.6 mm and was also visible on the prior CT scan.   Spleen: Size and appearance within normal limits. There is a 1.3 cm diameter accessory spleen.   Right Kidney: Length: 10.9 cm. Echogenicity within normal limits. No mass or hydronephrosis visualized.   Left Kidney: Length: 11.2 cm. Echogenicity within normal limits. No mass or hydronephrosis visualized.   Abdominal aorta: No aneurysm visualized.   Other findings: There is no ascites.   IMPRESSION: 1. Large gallstone without sonographic evidence of acute cholecystitis. 2. Fatty infiltrative change of the liver. 3. Minimal dilation of the pancreatic duct a 2.6 mm with similar findings noted on the 2014 CT scan. No pancreatic mass is  observed.     Electronically Signed   By: David  [place] M.D.   On: 06/15/2016 09:37  CMP     Component Value Date/Time   NA 139 08/29/2016 1628   K 4.3 08/29/2016 1628   CL 104 08/29/2016 1628   CO2 27  08/29/2016 1628   GLUCOSE 93 08/29/2016 1628   BUN 6 (L) 08/29/2016 1628   CREATININE 0.81 08/29/2016 1628   CALCIUM 9.2 08/29/2016 1628   PROT 7.3 08/29/2016 1628   ALBUMIN 4.4 08/29/2016 1628   AST 23 08/29/2016 1628   ALT 16 08/29/2016 1628   ALKPHOS 62 08/29/2016 1628   BILITOT 0.6 08/29/2016 1628   GFRNONAA 89 08/29/2016 1628   GFRAA >89 08/29/2016 1628   CBC    Component Value Date/Time   WBC 8.3 08/29/2016 1628   RBC 4.49 08/29/2016 1628   HGB 13.0 08/29/2016 1628   HCT 39.7 08/29/2016 1628   PLT 223 08/29/2016 1628   MCV 88.4 08/29/2016 1628   MCH 29.0 08/29/2016 1628   MCHC 32.7 08/29/2016 1628   RDW 16.6 (H) 08/29/2016 1628   LYMPHSABS 2,241 08/29/2016 1628   MONOABS 913 08/29/2016 1628   EOSABS 83 08/29/2016 1628   BASOSABS 0 08/29/2016 1628         Assessment & Plan:   46 year old female with IBS, chronic abdominal pain, anxiety and depression who is here for follow-up.  1. IBS with chronic, left-sided, abdominal pain/history of alternating diarrhea with constipation -- overall improvement in IBS symptoms with buspirone and Cymbalta. We will continue buspirone 10 mg twice a day and Cymbalta 60 mg daily. She is very happy with the plan.  2. Gallstone -- asymptomatic. At this time I do not think the gallstones seen by ultrasound is causing her symptoms. We reviewed symptoms of gallbladder type pain and she will notify me should her pain change in any way.  Annual follow-up, sooner if necessary 15 minutes spent with the patient today. Greater than 50% was spent in counseling and coordination of care with the patient

## 2016-11-13 NOTE — Patient Instructions (Signed)
Continue Buspar and Cymbalta  Please follow up in the next 9-12 months or sooner as needed.  If you are age 45 or older, your body mass index should be between 23-30. Your Body mass index is 32.08 kg/m. If this is out of the aforementioned range listed, please consider follow up with your Primary Care Provider.  If you are age 79 or younger, your body mass index should be between 19-25. Your Body mass index is 32.08 kg/m. If this is out of the aformentioned range listed, please consider follow up with your Primary Care Provider.

## 2016-11-22 ENCOUNTER — Encounter: Payer: Self-pay | Admitting: Family Medicine

## 2016-11-22 ENCOUNTER — Ambulatory Visit: Payer: Self-pay | Attending: Family Medicine | Admitting: Family Medicine

## 2016-11-22 VITALS — BP 131/87 | HR 98 | Temp 99.2°F | Resp 18 | Ht 61.0 in | Wt 177.0 lb

## 2016-11-22 DIAGNOSIS — Z79899 Other long term (current) drug therapy: Secondary | ICD-10-CM | POA: Insufficient documentation

## 2016-11-22 DIAGNOSIS — F329 Major depressive disorder, single episode, unspecified: Secondary | ICD-10-CM | POA: Insufficient documentation

## 2016-11-22 DIAGNOSIS — B009 Herpesviral infection, unspecified: Secondary | ICD-10-CM | POA: Insufficient documentation

## 2016-11-22 DIAGNOSIS — Z113 Encounter for screening for infections with a predominantly sexual mode of transmission: Secondary | ICD-10-CM | POA: Insufficient documentation

## 2016-11-22 DIAGNOSIS — Z01419 Encounter for gynecological examination (general) (routine) without abnormal findings: Secondary | ICD-10-CM | POA: Insufficient documentation

## 2016-11-22 DIAGNOSIS — Z30019 Encounter for initial prescription of contraceptives, unspecified: Secondary | ICD-10-CM

## 2016-11-22 DIAGNOSIS — F419 Anxiety disorder, unspecified: Secondary | ICD-10-CM | POA: Insufficient documentation

## 2016-11-22 DIAGNOSIS — N393 Stress incontinence (female) (male): Secondary | ICD-10-CM | POA: Insufficient documentation

## 2016-11-22 LAB — POCT URINE PREGNANCY: PREG TEST UR: NEGATIVE

## 2016-11-22 MED ORDER — NORGESTIM-ETH ESTRAD TRIPHASIC 0.18/0.215/0.25 MG-35 MCG PO TABS: 1.0000 | ORAL_TABLET | Freq: Every day | ORAL | 11 refills | Status: DC

## 2016-11-22 MED FILL — TRI-LINYAH TABLET: 0.18/0.215/ | 28 days supply | Qty: 28 | Fill #0

## 2016-11-22 NOTE — Progress Notes (Signed)
Patient is here for PAP  Patient denies pain for today  Patient is requesting Birth control pills  Patient has taking her current medication for today  Patient has eaten for today

## 2016-11-22 NOTE — Progress Notes (Signed)
Subjective:  Patient ID: Michele Vincent, female    DOB: 30-Aug-1970  Age: 46 y.o. MRN: [id number]  CC: Gynecologic Exam   HPI Delcie Ruppert presents for    Interpreter services used: Byrd Hesselbach [phone]   Well woman exam aw/ pap: Denies any family history of breast or gynecological cancers. Reports performing monthly SBE. Denies any  Lumps, nipple discharge, denting or dimpling of the breast. Denies any  vaginal discharge, lesions, or dysuria . Reports history of a miscarriage in 2015. Currently sexually active, she is requesting oral contraception. Denies smoking, HTN, history of clotting/ bleeding disorder, or migraines. Requests STI testing reports 1 partner with the  Last 3 months. PMH history depression and anxiety. Denies ant SI/HI. Reports adherence with current psychiatric medications.    Outpatient Medications Prior to Visit  Medication Sig Dispense Refill  . busPIRone (BUSPAR) 10 MG tablet Take 1 tablet (10 mg total) by mouth 2 (two) times daily. 60 tablet 3  . DULoxetine (CYMBALTA) 60 MG capsule Take 1 capsule (60 mg total) by mouth daily. 30 capsule 2  . Omega 3-6-9 Fatty Acids (OMEGA-3-6-9 PO) Take by mouth.     No facility-administered medications prior to visit.     ROS Review of Systems  Constitutional: Negative.   Respiratory: Negative.   Cardiovascular: Negative.   Gastrointestinal: Negative.   Genitourinary: Negative.   Skin: Negative.      Objective:  BP 131/87 (BP Location: Left Arm, Patient Position: Sitting, Cuff Size: Normal)   Pulse 98   Temp 99.2 F (37.3 C) (Oral)   Resp 18   Ht  (1.549 m)   Wt 177 lb (80.3 kg)   SpO2 100%   BMI 33.44 kg/m   BP/Weight 11/22/2016 11/13/2016 08/29/2016  Systolic BP 131 152 132  Diastolic BP 87 90 84  Wt. (Lbs) 177 174 175.4  BMI 33.44 32.08 32.08     Physical Exam  Cardiovascular: Normal rate, regular rhythm, normal heart sounds and intact distal pulses.   Pulmonary/Chest: Effort normal and  breath sounds normal. Right breast exhibits no mass, no nipple discharge and no skin change. Left breast exhibits no mass, no nipple discharge and no skin change.  Abdominal: Soft. Bowel sounds are normal.  Genitourinary: Cervix exhibits no discharge.  Skin: Skin is warm and dry.  Nursing note and vitals reviewed.   Assessment & Plan:   Problem List Items Addressed This Visit    None    Visit Diagnoses    Well woman exam with routine gynecological exam    -  Primary   Relevant Orders   Cytology - PAP [place]on   POCT urine pregnancy (Completed)   Encounter for female birth control       Relevant Medications   Norgestimate-Ethinyl Estradiol Triphasic (ORTHO TRI-CYCLEN, 28,) 0.18/0.215/0.25 MG-35 MCG tablet   Other Relevant Orders   POCT urine pregnancy (Completed)   Screening examination for STD (sexually transmitted disease)       Relevant Orders   HEP, RPR, HIV Panel (Completed)   HSV(herpes simplex vrs) 1+2 ab-IgG (Completed)   Stress incontinence in female       -Discussed Kegel exercises to help strengthen pelvic floor.       Meds ordered this encounter  Medications  . Norgestimate-Ethinyl Estradiol Triphasic (ORTHO TRI-CYCLEN, 28,) 0.18/0.215/0.25 MG-35 MCG tablet    Sig: Take 1 tablet by mouth daily.    Dispense:  1 Package    Refill:  11    Order  Specific Question:   Supervising Provider    Answer:   Quentin Angst [[id number]]    Follow-up: Return in about 3 years (around 11/23/2019) for Routine Pap.   Lizbeth Bark FNP

## 2016-11-22 NOTE — Patient Instructions (Signed)
Ethinyl Estradiol; Norgestimate tablets Qu es este medicamento? El [place] y NORGESTIMATO es un anticonceptivo oral. Estos productos [employer] tipos de hormonas femeninas, un estrgeno y Neomia Dear progestina. Estos productos se utilizan para Transport planner ovulacin y Firefighter. Algunos productos se utilizan tambin para tratar el acn en mujeres. Este medicamento puede ser utilizado para otros usos; si tiene alguna pregunta consulte con su proveedor de atencin mdica o con su farmacutico. MARCAS COMUNES: Estarylla, MONO-LINYAH, MonoNessa, Norgestimate/Ethinyl Estradiol, Ortho Tri-Cyclen, Ortho Tri-Cyclen Lo, Ortho-Cyclen, Previfem, Sprintec, Tri-Estarylla, TRI-LINYAH, Tri-Lo-Estarylla, Tri-Lo-Marzia, Tri-Lo-Sprintec, Tri-Previfem, Tri-Sprintec, Tri-VyLibra, Trinessa, Trinessa Lo, Lora Paula le debo informar a mi profesional de la salud antes de tomar este medicamento? Necesita saber si usted presenta alguno de los siguientes problemas o situaciones: -sangrado vaginal anormal -enfermedad vascular o cogulos sanguneos -cncer de mama, cervical, endometrio, ovario, hgado o uterino -diabetes -enfermedad de la vescula biliar -enfermedad cardiaca o ataque cardiaco reciente -alta presin sangunea -niveles elevados de colesterol -enfermedad renal -enfermedad heptica -migraas -derrame cerebral -lupus eritematoso sistmico (LES) -fumador -una reaccin alrgica o inusual a los estrgenos, las progestinas, otros medicamentos, alimentos, Software engineer o conservantes -si est embarazada o buscando quedar embarazada -si est amamantando a un beb Cmo debo Visual merchandiser medicamento? Tome este medicamento por va oral. Puede tomar este medicamento con alimentos para reducir las nuseas. Siga las instrucciones de la etiqueta del [place]. [employer] medicamento a la misma hora Management consultant y en el orden indicado en el paquete. No tome su medicamento con una frecuencia mayor a la indicada. Hable  con su pediatra acerca del uso de este medicamento en nios. Puede requerir atencin especial. Este medicamento ha sido usado en nias que han empezado a tener perodos [place]. Recibir un folleto de informacin para el paciente con cada receta y en cada ocasin que la vuelva a surtir. Asegrese de leer este folleto cada vez cuidadosamente. Este folleto puede cambiar con frecuencia. Sobredosis: Pngase en contacto inmediatamente con un centro toxicolgico o una sala de urgencia si usted cree que haya tomado demasiado medicamento. ATENCIN: [employer] es solo para usted. No comparta este medicamento con nadie. Qu sucede si me olvido de una dosis? Si olvida una dosis, refirase al folleto de informacin para el paciente proporcionado con su medicamento para instrucciones. Si olvida ms de Janyth Pupa, quiz el medicamento no ser tan efectivo y podr Sports coach un otro mtodo anticonceptivo. Qu puede interactuar con este medicamento? No tome esta medicina con los siguientes medicamentos: dasabuvir; ombitasvir: paritaprevir; ritonavir ombitasvir; paritaprevir; ritonavir Este medicamento tambin puede interactuar con los siguientes medicamentos: acetaminofeno (paracetamol) antibiticos o medicamentos para infecciones, especialmente rifampicina, rifabutina, rifapentina y [place], y posiblemente penicilina o tetraciclina aprepitant cido ascrbico (vitamina C) atorvastatina barbitricos tales como el fenobarbital bosentano carbamazepina cafena clofibrato ciclosporina dantroleno doxercalciferol felbamato jugo de toronja hidrocortisona medicamentos para la ansiedad o para los problemas para conciliar el sueo, como diazepam o temazepam medicamentos para la diabetes, como pioglitazona aceite mineral modafinilo micofenolato nefazodona oxcarbazepina fenitona prednisolona ritonavir u otros medicamentos para tratar el VIH o el SIDA rosuvastatina selegilina suplementos de isoflavonas de soya  hierba de [place] tamoxifeno o raloxifeno teofilina hormonas tiroideas topiramato warfarina Puede ser que esta lista no menciona todas las posibles interacciones. Informe a su profesional de [employer] de [employer] productos a base de hierbas, medicamentos de [place] o suplementos nutritivos que est tomando. Si usted fuma, consume bebidas alcohlicas o si utiliza drogas ilegales, indqueselo tambin a su profesional de [employer]. Algunas sustancias pueden interactuar  con su medicamento. A qu debo estar atento al usar [employer]? Visite a su mdico o a su profesional de la salud para revisar regularmente su evolucin. Debe hacerse exmenes de Papanicolaou y exmenes de las mamas y la pelvis en forma regular mientras est tomando este medicamento. Tambin debe conversar con su profesional de la salud sobre la necesidad de Conservator, museum/gallery, y seguir sus pautas para Programmer, multimedia. Este medicamento puede hacer que su cuerpo retenga lquido, lo que puede provocar que se le hinchen los dedos, las manos o los tobillos. Su presin sangunea Manufacturing engineer. Contacte a su mdico o profesional de la salud si siente que est reteniendo lquido. Use un mtodo anticonceptivo Public relations account executive ciclo que est tomando estas tabletas. Si tiene algn motivo para pensar que est embarazada, deje de usar este medicamento de inmediato y comunquese con su mdico o con su profesional de Radiographer, therapeutic. Si toma este medicamento para tratar problemas relacionados con las hormonas, pueden pasar varios ciclos hasta que observe mejoras en su condicin. No use este producto si fuma y tiene ms de 35 aos. El fumar aumenta el riesgo de formacin de cogulos o de sufrir un derrame cerebral mientras toma pldoras anticonceptivas orales, especialmente si tiene ms de 35 aos. Si fuma y tiene 49 aos o menos, se le aconseja enfticamente no fumar mientras toma pldoras anticonceptivas. Este medicamento puede  aumentar su sensibilidad al sol. Evite la [employer]. Si no la Network engineer, utilice ropa protectora y crema de Orthoptist. No utilice lmparas solares, camas solares ni cabinas solares. Si [place] lentes de contacto y observa cambios en la visin, o si los lentes comienzan a resultarle incmodos, consulte al especialista en ojos. Algunas mujeres pueden presentar sensibilidad, hinchazn o sangrado leve de las encas. Informe a su dentista si esto sucede. Cepillarse los dientes y usar hilo dental regularmente pueden ayudar a limitar este problema. Visite peridicamente a su dentista e infrmele acerca de los medicamentos que est tomando. Si va a someterse a Associate Professor, tal vez deba dejar de tomar este medicamento de antemano. Consulte a su profesional de [employer]. Este medicamento no la protege de la infeccin por VIH (SIDA) ni de ninguna otra enfermedad de transmisin sexual. Qu efectos secundarios puedo tener al utilizar este medicamento? Efectos secundarios que debe informar a su mdico o a Producer, television/film/video de la salud tan pronto como sea posible: -secreciones o cambios en el tejido de las mamas -cambios en el sangrado vaginal durante el perodo menstrual o entre perodos menstruales -dolor en el pecho -tos con sangre -mareos o desmayos -dolores de Training and development officer o migraas -Engineer, mining en las piernas, brazos o entrepierna -dolores de cabeza severos o repentinos -Theme park manager (severo) -falta de aliento repentina -falta de coordinacin repentina, especialmente en un lado del cuerpo -problemas del habla -sntomas de infeccin vaginal como picazn, irritacin o flujo inusual -sensibilidad en la parte superior del abdomen -vmito -debilidad o entumecimiento de los brazos o piernas, especialmente de un lado del cuerpo -color amarillento de los ojos o la piel Efectos secundarios que, por lo general, no requieren atencin mdica (debe informarlos a su mdico o a su profesional de la salud si  persisten o si son molestos): -sangrado o manchas importantes que continan despus de los 3 primeros ciclos de pldoras -sensibilidad en las mamas -cambios de estados de nimo, ansiedad, depresin, frustracin, ira o exaltacin -aumento de la sensibilidad al sol o a la luz ultravioleta -nuseas -erupcin cutnea, acn o  manchas marrones en la piel -aumento de peso (leve) Puede ser que esta lista no menciona todos los posibles efectos secundarios. Comunquese a su mdico por asesoramiento mdico [employer]. Usted puede informar los efectos secundarios a la FDA por telfono al 1-800-FDA-1088. Dnde debo guardar mi medicina? Mantngala fuera del alcance de los nios. Gurdela a [employer], entre 15 y 30 grados C (11 y 38 grados F). Deseche todo el medicamento que no haya utilizado, despus de la fecha de vencimiento. ATENCIN: Este folleto es un resumen. Puede ser que no cubra toda la posible informacin. Si usted tiene preguntas acerca de esta medicina, consulte con su mdico, su farmacutico o su profesional de Radiographer, therapeutic.  2018 Elsevier/Gold Standard (2016-08-24 00:00:00)

## 2016-11-23 LAB — HEP, RPR, HIV PANEL
HIV SCREEN 4TH GENERATION: NONREACTIVE
Hepatitis B Surface Ag: NEGATIVE
RPR: NONREACTIVE

## 2016-11-23 LAB — HSV(HERPES SIMPLEX VRS) I + II AB-IGG: HSV 1 GLYCOPROTEIN G AB, IGG: 20.6 {index} — AB (ref 0.00–0.90)

## 2016-11-27 LAB — CYTOLOGY - PAP
Adequacy: ABSENT
Diagnosis: NEGATIVE
HPV: NOT DETECTED

## 2016-11-27 LAB — CERVICOVAGINAL ANCILLARY ONLY
BACTERIAL VAGINITIS: POSITIVE — AB
CANDIDA VAGINITIS: NEGATIVE
Chlamydia: NEGATIVE
Neisseria Gonorrhea: NEGATIVE
TRICH (WINDOWPATH): NEGATIVE

## 2016-11-28 ENCOUNTER — Telehealth: Payer: Self-pay

## 2016-11-28 ENCOUNTER — Other Ambulatory Visit: Payer: Self-pay | Admitting: Family Medicine

## 2016-11-28 DIAGNOSIS — N760 Acute vaginitis: Principal | ICD-10-CM

## 2016-11-28 DIAGNOSIS — B9689 Other specified bacterial agents as the cause of diseases classified elsewhere: Secondary | ICD-10-CM

## 2016-11-28 MED ORDER — METRONIDAZOLE 500 MG PO TABS: 500.0000 mg | ORAL_TABLET | Freq: Two times a day (BID) | ORAL | 0 refills | Status: DC

## 2016-11-28 MED FILL — ?METRONIDAZOLE 500 MG TABLE: 500 | 7 days supply | Qty: 14 | Fill #0

## 2016-11-28 NOTE — Telephone Encounter (Signed)
CMA call patient regarding lab results   Patient Verify DOB   Patient was aware and understood  

## 2016-11-28 NOTE — Telephone Encounter (Signed)
-----   Message from Lizbeth Bark, FNP sent at 11/28/2016  5:17 AM EDT ----- HIV is negative.  Hepatitis B is negative.  Syphilis is negative Herpes type 2, Gonorrhea, Chlamydia, Yeast, and Trichomonas were all negative. Bacterial vaginosis was positive. BV is caused by an overgrowth of germs in the vagina. To reduce your risk of developing BV don't douche, don't use scented soap or sprays, and use protection during sexual intercourse. You will be prescribed metronidazole to treat.  Herpes type 1 which is primarily responsible for cold sores is positive. When you have cold sores do not kiss anyone, share utensils, or have oral sex. Pap smear showed no lesions or malignancy.

## 2016-11-30 LAB — CERVICOVAGINAL ANCILLARY ONLY: Herpes: NEGATIVE

## 2016-12-25 ENCOUNTER — Other Ambulatory Visit: Payer: Self-pay | Admitting: Internal Medicine

## 2016-12-25 ENCOUNTER — Other Ambulatory Visit: Payer: Self-pay | Admitting: Family Medicine

## 2016-12-25 DIAGNOSIS — F419 Anxiety disorder, unspecified: Secondary | ICD-10-CM

## 2016-12-25 MED FILL — ?DULOXETINE HCL DR 60 MG CA: 60 MG | 30 days supply | Qty: 30 | Fill #2

## 2016-12-26 MED FILL — ?BUSPIRONE HCL 10 MG TABLET: 10 | 30 days supply | Qty: 60 | Fill #0

## 2017-03-20 MED FILL — ?DULOXETINE HCL DR 60 MG CA: 60 MG | 30 days supply | Qty: 30 | Fill #0

## 2017-03-20 MED FILL — busPIRone HCL 10 MG TABS: 10 | 30 days supply | Qty: 60 | Fill #1

## 2017-03-23 ENCOUNTER — Encounter (HOSPITAL_COMMUNITY): Payer: Self-pay | Admitting: Emergency Medicine

## 2017-03-23 DIAGNOSIS — K5792 Diverticulitis of intestine, part unspecified, without perforation or abscess without bleeding: Secondary | ICD-10-CM | POA: Insufficient documentation

## 2017-03-23 DIAGNOSIS — Z79899 Other long term (current) drug therapy: Secondary | ICD-10-CM | POA: Insufficient documentation

## 2017-03-23 MED ORDER — ONDANSETRON 4 MG PO TBDP
4.0000 mg | ORAL_TABLET | Freq: Once | ORAL | Status: AC
  Administered 2017-03-23: 4 mg via ORAL

## 2017-03-23 MED ORDER — ONDANSETRON 4 MG PO TBDP
ORAL_TABLET | ORAL | Status: AC
  Filled 2017-03-23: qty 1

## 2017-03-23 NOTE — ED Triage Notes (Signed)
Pt is spanish speaking only, ipad used for triage Pt c/o low abd pain x 2 weeks, nausea, emesis x 1 in 24 hours, intermittent diarrhea and constipation. +dysuria, frequency Denies gyn s/s

## 2017-03-24 ENCOUNTER — Emergency Department (HOSPITAL_COMMUNITY)
Admission: EM | Admit: 2017-03-24 | Discharge: 2017-03-24 | Disposition: A | Discharge status: Home / Self Care | Payer: Self-pay | Attending: Emergency Medicine | Admitting: Emergency Medicine

## 2017-03-24 ENCOUNTER — Encounter (HOSPITAL_COMMUNITY): Payer: Self-pay

## 2017-03-24 ENCOUNTER — Emergency Department (HOSPITAL_COMMUNITY): Payer: Self-pay

## 2017-03-24 DIAGNOSIS — K5792 Diverticulitis of intestine, part unspecified, without perforation or abscess without bleeding: Secondary | ICD-10-CM

## 2017-03-24 LAB — COMPREHENSIVE METABOLIC PANEL
ALBUMIN: 4.2 g/dL (ref 3.5–5.0)
ALT: 19 U/L (ref 14–54)
ANION GAP: 10 (ref 5–15)
AST: 14 U/L — ABNORMAL LOW (ref 15–41)
Alkaline Phosphatase: 80 U/L (ref 38–126)
BUN: 5 mg/dL — ABNORMAL LOW (ref 6–20)
CO2: 24 mmol/L (ref 22–32)
Calcium: 9.3 mg/dL (ref 8.9–10.3)
Chloride: 104 mmol/L (ref 101–111)
Creatinine, Ser: 0.74 mg/dL (ref 0.44–1.00)
GFR calc Af Amer: >60 mL/min (ref >60)
GFR calc non Af Amer: >60 mL/min (ref >60)
GLUCOSE: 123 mg/dL — AB (ref 65–99)
POTASSIUM: 3.5 mmol/L (ref 3.5–5.1)
SODIUM: 138 mmol/L (ref 135–145)
TOTAL PROTEIN: 7.8 g/dL (ref 6.5–8.1)
Total Bilirubin: 0.8 mg/dL (ref 0.3–1.2)

## 2017-03-24 LAB — CBC
HEMATOCRIT: 37.4 % (ref 36.0–46.0)
HEMOGLOBIN: 12.8 g/dL (ref 12.0–15.0)
MCH: 29.4 pg (ref 26.0–34.0)
MCHC: 34.2 g/dL (ref 30.0–36.0)
MCV: 86 fL (ref 78.0–100.0)
Platelets: 342 10*3/uL (ref 150–400)
RBC: 4.35 MIL/uL (ref 3.87–5.11)
RDW: 12.9 % (ref 11.5–15.5)
WBC: 14.1 10*3/uL — ABNORMAL HIGH (ref 4.0–10.5)

## 2017-03-24 LAB — URINALYSIS, ROUTINE W REFLEX MICROSCOPIC
BILIRUBIN URINE: NEGATIVE
Bacteria, UA: NONE SEEN
Glucose, UA: NEGATIVE mg/dL
Ketones, ur: NEGATIVE mg/dL
Leukocytes, UA: NEGATIVE
NITRITE: NEGATIVE
PH: 5 (ref 5.0–8.0)
Protein, ur: NEGATIVE mg/dL
SPECIFIC GRAVITY, URINE: 1.012 (ref 1.005–1.030)

## 2017-03-24 LAB — I-STAT TROPONIN, ED: Troponin i, poc: 0 ng/mL (ref 0.00–0.08)

## 2017-03-24 LAB — I-STAT BETA HCG BLOOD, ED (MC, WL, AP ONLY): I-stat hCG, quantitative: 7.9 m[IU]/mL — ABNORMAL HIGH (ref <5)

## 2017-03-24 LAB — PREGNANCY, URINE: PREG TEST UR: NEGATIVE

## 2017-03-24 LAB — LIPASE, BLOOD: Lipase: 23 U/L (ref 11–51)

## 2017-03-24 MED ORDER — MORPHINE SULFATE (PF) 4 MG/ML IV SOLN
4.0000 mg | Freq: Once | INTRAVENOUS | Status: AC
  Administered 2017-03-24: 4 mg via INTRAVENOUS
  Filled 2017-03-24: qty 1

## 2017-03-24 MED ORDER — SODIUM CHLORIDE 0.9 % IV BOLUS (SEPSIS)
1000.0000 mL | Freq: Once | INTRAVENOUS | Status: AC
  Administered 2017-03-24: 1000 mL via INTRAVENOUS

## 2017-03-24 MED ORDER — IOPAMIDOL (ISOVUE-300) INJECTION 61%
INTRAVENOUS | Status: AC
  Administered 2017-03-24: 100 mL
  Filled 2017-03-24: qty 100

## 2017-03-24 MED ORDER — METRONIDAZOLE 500 MG PO TABS: 500.0000 mg | ORAL_TABLET | Freq: Three times a day (TID) | ORAL | 0 refills | Status: DC

## 2017-03-24 MED ORDER — ONDANSETRON HCL 4 MG/2ML IJ SOLN
4.0000 mg | Freq: Once | INTRAMUSCULAR | Status: AC
  Administered 2017-03-24: 4 mg via INTRAVENOUS
  Filled 2017-03-24: qty 2

## 2017-03-24 MED ORDER — CIPROFLOXACIN HCL 500 MG PO TABS: 500.0000 mg | ORAL_TABLET | Freq: Two times a day (BID) | ORAL | 0 refills | Status: DC

## 2017-03-24 NOTE — ED Provider Notes (Signed)
  Physical Exam  BP (!) 139/95   Pulse 89   Temp 98.6 F (37 C) (Oral)   Resp 16   Wt 78.9 kg (174 lb)   LMP 03/11/2017   SpO2 97%   BMI 32.88 kg/m   Physical Exam  ED Course  Procedures  MDM CT scan shows diverticulitis. Well-appearing otherwise. Will discharge home with GI follow-up.       Benjiman Core, MD 03/24/17 [phone]

## 2017-03-24 NOTE — ED Provider Notes (Signed)
[facility]-EMER[facility]ENCY DEPT Provider Note   CSN: [id number] Arrival date & time: 03/23/17  2234     History   Chief Complaint Chief Complaint  Patient presents with  . Abdominal Pain    HPI Michele Vincent is a 46 y.o. female.  HPI Patient with recurrent abdominal pain presents with gradually worsening left lower quadrant pain for the past 2 weeks. This developed nausea when episode of vomiting. She has intermittent mixed diarrhea and constipation. There's been no blood in the stool or vomit. States she has regular menstrual periods. Denies any current vaginal bleeding or discharge. Mild urinary frequency but no hematuria or dysuria. Past Medical History:  Diagnosis Date  . Anxiety   . Cholelithiases   . Colitis   . Depression   . Diverticulosis   . Fatty liver   . Fatty liver   . Gastritis   . IBS (irritable bowel syndrome)     Patient Active Problem List   Diagnosis Date Noted  . IBS (irritable bowel syndrome) 08/29/2016  . Pap smear for cervical cancer screening 10/26/2014  . Obesity 09/04/2014  . Anxiety 09/04/2014  . Depressed 09/04/2014  . SAB (spontaneous abortion) 09/04/2014  . Unspecified constipation 01/06/2014  . GERD (gastroesophageal reflux disease) 01/06/2014  . Dyspepsia 06/30/2013  . Abdominal pain, left lower quadrant 06/30/2013    Past Surgical History:  Procedure Laterality Date  . COLONOSCOPY    . NO PAST SURGERIES      OB History    This patient's OB History needs to be verified. Open OB History to review and resolve any issues.   Gravida Para Term Preterm AB Living   2 1 1  0 1     SAB TAB Ectopic Multiple Live Births   1 0 0 0         Home Medications    Prior to Admission medications   Medication Sig Start Date End Date Taking? Authorizing Provider  busPIRone (BUSPAR) 10 MG tablet TAKE 1 TABLET BY MOUTH 2 TIMES DAILY. 12/26/16   Lizbeth Bark, FNP  ciprofloxacin (CIPRO) 500 MG tablet Take 1 tablet (500 mg total) by mouth 2  (two) times daily. 03/24/17   Benjiman Core, MD  DULoxetine (CYMBALTA) 60 MG capsule Take 1 capsule (60 mg total) by mouth daily. 08/29/16   Lizbeth Bark, FNP  DULoxetine (CYMBALTA) 60 MG capsule TAKE 1 CAPSULE BY MOUTH DAILY. 12/26/16   Pyrtle, Carie Caddy, MD  metroNIDAZOLE (FLAGYL) 500 MG tablet Take 1 tablet (500 mg total) by mouth 3 (three) times daily. 03/24/17   Benjiman Core, MD  Norgestimate-Ethinyl Estradiol Triphasic (ORTHO TRI-CYCLEN, 28,) 0.18/0.215/0.25 MG-35 MCG tablet Take 1 tablet by mouth daily. 11/22/16   Lizbeth Bark, FNP  Omega 3-6-9 Fatty Acids (OMEGA-3-6-9 PO) Take by mouth.    [provider]    Family History Family History  Problem Relation Age of Onset  . CVA Mother   . Hypertension Mother   . Pneumonia Father   . Colon cancer Neg Hx     Social History Social History  Substance Use Topics  . Smoking status: Never Smoker  . Smokeless tobacco: Never Used  . Alcohol use No     Allergies   Patient has no known allergies.   Review of Systems Review of Systems  Constitutional: Negative for chills, fatigue and fever.  HENT: Negative for facial swelling, trouble swallowing and voice change.   Respiratory: Negative for cough and shortness of breath.   Cardiovascular: Negative  for chest pain, palpitations and leg swelling.  Gastrointestinal: Positive for abdominal pain, constipation, diarrhea, nausea and vomiting. Negative for blood in stool.  Genitourinary: Positive for frequency. Negative for difficulty urinating, dysuria and flank pain.  Musculoskeletal: Negative for back pain, myalgias, neck pain and neck stiffness.  Skin: Negative for rash and wound.  Neurological: Negative for dizziness, weakness, light-headedness, numbness and headaches.  All other systems reviewed and are negative.    Physical Exam Updated Vital Signs BP (!) 139/95   Pulse 89   Temp 98.6 F (37 C) (Oral)   Resp 16   Wt 78.9 kg (174 lb)   LMP  03/11/2017   SpO2 97%   BMI 32.88 kg/m   Physical Exam  Constitutional: She is oriented to person, place, and time. She appears well-developed and well-nourished. No distress.  HENT:  Head: Normocephalic and atraumatic.  Mouth/Throat: Oropharynx is clear and moist. No oropharyngeal exudate.  Eyes: Pupils are equal, round, and reactive to light. EOM are normal.  Neck: Normal range of motion. Neck supple.  Cardiovascular: Normal rate and regular rhythm.  Exam reveals no gallop and no friction rub.   No murmur heard. Pulmonary/Chest: Effort normal and breath sounds normal. No respiratory distress. She has no wheezes. She has no rales. She exhibits no tenderness.  Abdominal: Soft. Bowel sounds are normal. There is tenderness (left lower quadrant tenderness to palpation.). There is no rebound and no guarding.  Musculoskeletal: Normal range of motion. She exhibits no edema or tenderness.  No midline thoracic or lumbar tenderness. No CVA tenderness.  Neurological: She is alert and oriented to person, place, and time.  Moving all extremities without focal deficit. Sensation fully intact.  Skin: Skin is warm and dry. No rash noted. She is not diaphoretic. No erythema.  Psychiatric: She has a normal mood and affect. Her behavior is normal.  Nursing note and vitals reviewed.    ED Treatments / Results  Labs (all labs ordered are listed, but only abnormal results are displayed) Labs Reviewed  COMPREHENSIVE METABOLIC PANEL - Abnormal; Notable for the following:       Result Value   Glucose, Bld 123 (*)    BUN 5 (*)    AST 14 (*)    All other components within normal limits  CBC - Abnormal; Notable for the following:    WBC 14.1 (*)    All other components within normal limits  URINALYSIS, ROUTINE W REFLEX MICROSCOPIC - Abnormal; Notable for the following:    APPearance HAZY (*)    Hgb urine dipstick SMALL (*)    Squamous Epithelial / LPF 0-5 (*)    All other components within normal  limits  I-STAT BETA HCG BLOOD, ED ([facility], [facility], [facility] ONLY) - Abnormal; Notable for the following:    I-stat hCG, quantitative 7.9 (*)    All other components within normal limits  LIPASE, BLOOD  PREGNANCY, URINE  I-STAT TROPONIN, ED    EKG  EKG Interpretation None       Radiology No results found.  Procedures Procedures (including critical care time)  Medications Ordered in ED Medications  ondansetron (ZOFRAN-ODT) disintegrating tablet 4 mg (4 mg Oral Given 03/23/17 2335)  sodium chloride 0.9 % bolus 1,000 mL (0 mLs Intravenous Stopped 03/24/17 1031)  morphine 4 MG/ML injection 4 mg (4 mg Intravenous Given 03/24/17 0643)  ondansetron (ZOFRAN) injection 4 mg (4 mg Intravenous Given 03/24/17 0641)  iopamidol (ISOVUE-300) 61 % injection (100 mLs  Contrast Given 03/24/17 0938)  Initial Impression / Assessment and Plan / ED Course  I have reviewed the triage vital signs and the nursing notes.  Pertinent labs & imaging results that were available during my care of the patient were reviewed by me and considered in my medical decision making (see chart for details).     Signed out to oncoming emergency physician pending CT abdomen.  Final Clinical Impressions(s) / ED Diagnoses   Final diagnoses:  Diverticulitis    New Prescriptions Discharge Medication List as of 03/24/2017 12:21 PM    START taking these medications   Details  ciprofloxacin (CIPRO) 500 MG tablet Take 1 tablet (500 mg total) by mouth 2 (two) times daily., Starting Sat 03/24/2017, Print         Loren Racer, MD 03/26/17 [phone]

## 2017-04-25 MED FILL — busPIRone HCL 10 MG TABS: 10 | 30 days supply | Qty: 60 | Fill #2

## 2017-04-25 MED FILL — ?DULOXETINE HCL DR 60 MG CA: 60 MG | 30 days supply | Qty: 30 | Fill #1

## 2017-06-05 MED FILL — ?DULOXETINE HCL DR 60 MG CA: 60 MG | 30 days supply | Qty: 30 | Fill #2

## 2017-06-05 MED FILL — busPIRone HCL 10 MG TABS: 10 | 30 days supply | Qty: 60 | Fill #3

## 2017-08-02 ENCOUNTER — Other Ambulatory Visit: Payer: Self-pay | Admitting: Internal Medicine

## 2017-08-02 DIAGNOSIS — F32A Depression, unspecified: Secondary | ICD-10-CM

## 2017-08-02 DIAGNOSIS — F329 Major depressive disorder, single episode, unspecified: Secondary | ICD-10-CM

## 2017-08-02 DIAGNOSIS — F419 Anxiety disorder, unspecified: Secondary | ICD-10-CM

## 2017-08-02 NOTE — Telephone Encounter (Signed)
Patient states she needs medication Buspar and duloxetine refilled.

## 2017-08-03 MED ORDER — BUSPIRONE HCL 10 MG PO TABS: 10.0000 mg | ORAL_TABLET | Freq: Two times a day (BID) | ORAL | 3 refills | Status: DC

## 2017-08-03 MED ORDER — DULOXETINE HCL 60 MG PO CPEP: 60.0000 mg | ORAL_CAPSULE | Freq: Every day | ORAL | 2 refills | Status: DC

## 2017-08-03 NOTE — Telephone Encounter (Signed)
Refilled Buspar and Cymbalta

## 2017-08-10 MED FILL — busPIRone HCL 10 MG TABS: 10 | 30 days supply | Qty: 60 | Fill #0

## 2017-08-10 MED FILL — ?DULOXETINE HCL DR 60 MG CA: 60 MG | 30 days supply | Qty: 30 | Fill #0

## 2017-09-27 MED FILL — ?DULoxetine HCL 60MG CPEP: 60 | 30 days supply | Qty: 30 | Fill #1

## 2017-09-27 MED FILL — busPIRone HCL 10 MG TABS: 10 | 30 days supply | Qty: 60 | Fill #1

## 2017-12-26 MED FILL — busPIRone HCL 10 MG TABS: 10 | 30 days supply | Qty: 60 | Fill #2

## 2017-12-26 MED FILL — DULoxetine HCL 60 MG CPEP: 60 | 30 days supply | Qty: 30 | Fill #2

## 2018-04-04 ENCOUNTER — Telehealth: Payer: Self-pay | Admitting: Internal Medicine

## 2018-04-04 DIAGNOSIS — F419 Anxiety disorder, unspecified: Secondary | ICD-10-CM

## 2018-04-04 MED ORDER — BUSPIRONE HCL 10 MG PO TABS: 10.0000 mg | ORAL_TABLET | Freq: Two times a day (BID) | ORAL | 0 refills | Status: DC

## 2018-04-04 MED ORDER — DULOXETINE HCL 60 MG PO CPEP: 60.0000 mg | ORAL_CAPSULE | Freq: Every day | ORAL | 0 refills | Status: DC

## 2018-04-04 NOTE — Telephone Encounter (Signed)
Rx sent 

## 2018-04-05 MED FILL — busPIRone HCL 10 MG TABS: 10 | 30 days supply | Qty: 60 | Fill #0

## 2018-04-05 MED FILL — DULoxetine HCL 60 MG CPEP: 60 | 30 days supply | Qty: 30 | Fill #0

## 2018-05-10 ENCOUNTER — Encounter: Payer: Self-pay | Admitting: *Deleted

## 2018-05-28 ENCOUNTER — Encounter: Payer: Self-pay | Admitting: Internal Medicine

## 2018-05-28 ENCOUNTER — Ambulatory Visit: Payer: Self-pay | Admitting: Internal Medicine

## 2018-05-28 VITALS — BP 132/90 | HR 89 | Ht 61.0 in | Wt 179.0 lb

## 2018-05-28 DIAGNOSIS — K582 Mixed irritable bowel syndrome: Secondary | ICD-10-CM

## 2018-05-28 DIAGNOSIS — F32A Depression, unspecified: Secondary | ICD-10-CM

## 2018-05-28 DIAGNOSIS — F419 Anxiety disorder, unspecified: Secondary | ICD-10-CM

## 2018-05-28 DIAGNOSIS — F329 Major depressive disorder, single episode, unspecified: Secondary | ICD-10-CM

## 2018-05-28 MED ORDER — DICYCLOMINE HCL 10 MG PO CAPS: 10.0000 mg | ORAL_CAPSULE | Freq: Three times a day (TID) | ORAL | 1 refills | Status: DC | PRN

## 2018-05-28 MED ORDER — BUSPIRONE HCL 10 MG PO TABS: 10.0000 mg | ORAL_TABLET | Freq: Two times a day (BID) | ORAL | 2 refills | Status: DC

## 2018-05-28 MED ORDER — DULOXETINE HCL 60 MG PO CPEP: 60.0000 mg | ORAL_CAPSULE | Freq: Every day | ORAL | 2 refills | Status: DC

## 2018-05-28 NOTE — Patient Instructions (Signed)
We have sent the following medications to your pharmacy for you to pick up at your convenience: Cymbalta Buspar Bentyl 10 mg three times daily as needed for crampy abdominal pain  Follow up with Dr Rhea Belton in 1 year.  If you are age 46 or older, your body mass index should be between 23-30. Your Body mass index is 33.82 kg/m. If this is out of the aforementioned range listed, please consider follow up with your Primary Care Provider.  If you are age 52 or younger, your body mass index should be between 19-25. Your Body mass index is 33.82 kg/m. If this is out of the aformentioned range listed, please consider follow up with your Primary Care Provider.

## 2018-05-28 NOTE — Progress Notes (Signed)
   Subjective:    Patient ID: Oneita Hurt, female    DOB: 1971/06/15, 47 y.o.   MRN: [id number]  HPI Daphna Lafuente is a 47 year female with PMH of IBS, chronic abdominal pain, anxiety depression who is here for follow-up.  She is here today with a medical Spanish interpreter and a female companion.  She was last seen on 11/13/2016.  She reports that she has been doing well.  She continues on buspirone and Cymbalta.  She will occasionally have her left-sided abdominal pain but this is much improved with current medications.  When it occurs she feels that it is diet related.  When she is stressed or anxious she will feel nausea but no vomiting.  No trouble swallowing.  Bowel movements are mostly regular with less alternation between diarrhea and constipation.  No blood in her stool or melena.  She does request a work note to be allowed bathroom breaks and scheduled mealtimes.  Review of Systems As per HPI, otherwise negative  Current Medications, Allergies, Past Medical History, Past Surgical History, Family History and Social History were reviewed in [employer] record.     Objective:   Physical Exam BP 132/90   Pulse 89   Ht 5\' 1"  (1.549 m)   Wt 179 lb (81.2 kg)   LMP 05/27/2018 (Approximate)   BMI 33.82 kg/m  Gen: awake, alert, NAD HEENT: anicteric, op clear CV: RRR, no mrg Pulm: CTA b/l Abd: soft, NT/ND, +BS throughout Ext: no c/c/e Neuro: nonfocal      Assessment & Plan:  47 year female with PMH of IBS, chronic abdominal pain, anxiety depression who is here for follow-up.   1.  IBS with chronic left-sided abdominal pain/alternating bowel habit --times well-controlled with current therapy.  Continue buspirone 10 mg twice daily and Cymbalta 60 mg daily.  Will also prescribe Bentyl 10 mg to be used up to 3 times a day on an as-needed basis for left-sided or crampy lower abdominal discomfort should this occur.  2.  CRC screening, normal  colonoscopy except for diverticulosis in September 2015.  Repeat in September 2025  Will write work note as requested  25 minutes spent with the patient today. Greater than 50% was spent in counseling and coordination of care with the patient  Follow-up in 1 to 2 years, sooner if needed

## 2018-06-07 MED FILL — DICYCLOMINE 10 MG CAPSULE: 10 | 30 days supply | Qty: 90 | Fill #0

## 2018-06-07 MED FILL — DULoxetine HCL 60 MG CPEP: 60 | 30 days supply | Qty: 30 | Fill #0

## 2018-06-07 MED FILL — busPIRone HCL 10 MG TABS: 10 | 90 days supply | Qty: 180 | Fill #0

## 2018-06-24 ENCOUNTER — Ambulatory Visit: Payer: Self-pay | Attending: Nurse Practitioner | Admitting: Nurse Practitioner

## 2018-06-24 ENCOUNTER — Encounter: Payer: Self-pay | Admitting: Nurse Practitioner

## 2018-06-24 VITALS — BP 149/98 | HR 89 | Temp 99.1°F | Ht 61.0 in | Wt 180.0 lb

## 2018-06-24 DIAGNOSIS — Z7689 Persons encountering health services in other specified circumstances: Secondary | ICD-10-CM | POA: Insufficient documentation

## 2018-06-24 DIAGNOSIS — Z6834 Body mass index (BMI) 34.0-34.9, adult: Secondary | ICD-10-CM | POA: Insufficient documentation

## 2018-06-24 DIAGNOSIS — F419 Anxiety disorder, unspecified: Secondary | ICD-10-CM | POA: Insufficient documentation

## 2018-06-24 DIAGNOSIS — F32A Depression, unspecified: Secondary | ICD-10-CM

## 2018-06-24 DIAGNOSIS — K589 Irritable bowel syndrome without diarrhea: Secondary | ICD-10-CM | POA: Insufficient documentation

## 2018-06-24 DIAGNOSIS — E669 Obesity, unspecified: Secondary | ICD-10-CM | POA: Insufficient documentation

## 2018-06-24 DIAGNOSIS — F329 Major depressive disorder, single episode, unspecified: Secondary | ICD-10-CM | POA: Insufficient documentation

## 2018-06-24 DIAGNOSIS — Z79899 Other long term (current) drug therapy: Secondary | ICD-10-CM | POA: Insufficient documentation

## 2018-06-24 NOTE — Progress Notes (Signed)
Assessment & Plan:  Michele Vincent was seen today for establish care.  Diagnoses and all orders for this visit:  Anxiety and depression -     TSH Continue medications as prescribed.   Obesity (BMI 30.0-34.9) -     CMP14+EGFR -     Lipid panel -     CBC; Future    Patient has been counseled on age-appropriate routine health concerns for screening and prevention. These are reviewed and up-to-date. Referrals have been placed accordingly. Immunizations are up-to-date or declined.    Subjective:   Chief Complaint  Patient presents with  . Establish Care    Pt. is here to establish care. Pt. stated she is here anxiety and depression.    HPI Michele Vincent 47 y.o. female presents to office today to establish care. VRI was used to communicate directly with patient for the entire encounter including providing detailed patient instructions. She has a history of IBS and is currently seeing a gastroenterologist.    Anxiety and Depression She has a history of anxiety and Depression and was diagnosed over 10 years ago. She states she has tried to stop taking Cymbalta in the past however states each time she stops taking Cymbalta her depression worsens. Current regimen includes Cymbalta 12m daily and Buspar 125mBID. She endorses mood stability and currently denies any thoughts of suicidal ideation.       Review of Systems  Constitutional: Negative for fever, malaise/fatigue and weight loss.  HENT: Negative.  Negative for nosebleeds.   Eyes: Negative.  Negative for blurred vision, double vision and photophobia.  Respiratory: Negative.  Negative for cough and shortness of breath.   Cardiovascular: Negative.  Negative for chest pain, palpitations and leg swelling.  Gastrointestinal: Negative.  Negative for heartburn, nausea and vomiting.  Musculoskeletal: Negative.  Negative for myalgias.  Neurological: Negative.  Negative for dizziness, focal weakness, seizures and headaches.    Psychiatric/Behavioral: Positive for depression. Negative for suicidal ideas. The patient is nervous/anxious.     Past Medical History:  Diagnosis Date  . Anxiety   . Cholelithiases   . Colitis   . Depression   . Diverticulitis   . Diverticulosis   . Fatty liver   . Gastritis   . IBS (irritable bowel syndrome)     Past Surgical History:  Procedure Laterality Date  . COLONOSCOPY    . NO PAST SURGERIES      Family History  Problem Relation Age of Onset  . CVA Mother   . Hypertension Mother   . Pneumonia Father   . Colon cancer Neg Hx     Social History Reviewed with no changes to be made today.   Outpatient Medications Prior to Visit  Medication Sig Dispense Refill  . busPIRone (BUSPAR) 10 MG tablet Take 1 tablet (10 mg total) by mouth 2 (two) times daily. 180 tablet 2  . dicyclomine (BENTYL) 10 MG capsule Take 1 capsule (10 mg total) by mouth 3 (three) times daily as needed (crampy abdominal pain). 90 capsule 1  . DULoxetine (CYMBALTA) 60 MG capsule Take 1 capsule (60 mg total) by mouth daily. 90 capsule 2   No facility-administered medications prior to visit.     No Known Allergies     Objective:    BP (!) 149/98 (BP Location: Right Arm, Patient Position: Sitting, Cuff Size: Normal)   Pulse 89   Temp 99.1 F (37.3 C) (Oral)   Ht 5' 1"  (1.549 m)   Wt 180 lb (81.6  kg)   LMP 06/23/2018   SpO2 95%   BMI 34.01 kg/m  Wt Readings from Last 3 Encounters:  06/24/18 180 lb (81.6 kg)  05/28/18 179 lb (81.2 kg)  03/23/17 174 lb (78.9 kg)    Physical Exam  Constitutional: She is oriented to person, place, and time. She appears well-developed and well-nourished. She is cooperative.  HENT:  Head: Normocephalic and atraumatic.  Eyes: EOM are normal.  Neck: Normal range of motion.  Cardiovascular: Normal rate, regular rhythm, normal heart sounds and intact distal pulses. Exam reveals no gallop and no friction rub.  No murmur heard. Pulmonary/Chest: Effort normal  and breath sounds normal. No tachypnea. No respiratory distress. She has no decreased breath sounds. She has no wheezes. She has no rhonchi. She has no rales. She exhibits no tenderness.  Abdominal: Bowel sounds are normal.  Musculoskeletal: Normal range of motion. She exhibits no edema.  Neurological: She is alert and oriented to person, place, and time. Coordination normal.  Skin: Skin is warm and dry.  Psychiatric: She has a normal mood and affect. Her behavior is normal. Judgment and thought content normal.  Nursing note and vitals reviewed.        Patient has been counseled extensively about nutrition and exercise as well as the importance of adherence with medications and regular follow-up. The patient was given clear instructions to go to ER or return to medical center if symptoms don't improve, worsen or new problems develop. The patient verbalized understanding.   Follow-up: Return for Needs appointment with financial representative.Gildardo Pounds, FNP-BC [facility] [place], [place]   06/24/2018, 7:33 PM

## 2018-06-25 LAB — CMP14+EGFR
ALT: 17 IU/L (ref 0–32)
AST: 21 IU/L (ref 0–40)
Albumin/Globulin Ratio: 1.9 (ref 1.2–2.2)
Albumin: 4.7 g/dL (ref 3.5–5.5)
Alkaline Phosphatase: 71 IU/L (ref 39–117)
BUN/Creatinine Ratio: 8 — ABNORMAL LOW (ref 9–23)
BUN: 6 mg/dL (ref 6–24)
Bilirubin Total: 0.3 mg/dL (ref 0.0–1.2)
CO2: 18 mmol/L — AB (ref 20–29)
Calcium: 9.2 mg/dL (ref 8.7–10.2)
Chloride: 106 mmol/L (ref 96–106)
Creatinine, Ser: 0.79 mg/dL (ref 0.57–1.00)
GFR calc Af Amer: 104 mL/min/{1.73_m2} (ref >59)
GFR, EST NON AFRICAN AMERICAN: 90 mL/min/{1.73_m2} (ref >59)
GLOBULIN, TOTAL: 2.5 g/dL (ref 1.5–4.5)
Glucose: 107 mg/dL — ABNORMAL HIGH (ref 65–99)
Potassium: 4.3 mmol/L (ref 3.5–5.2)
SODIUM: 143 mmol/L (ref 134–144)
Total Protein: 7.2 g/dL (ref 6.0–8.5)

## 2018-06-25 LAB — TSH: TSH: 5.27 u[IU]/mL — ABNORMAL HIGH (ref 0.450–4.500)

## 2018-06-25 LAB — LIPID PANEL
CHOL/HDL RATIO: 3 ratio (ref 0.0–4.4)
Cholesterol, Total: 216 mg/dL — ABNORMAL HIGH (ref 100–199)
HDL: 73 mg/dL (ref >39)
LDL CALC: 114 mg/dL — AB (ref 0–99)
TRIGLYCERIDES: 146 mg/dL (ref 0–149)
VLDL Cholesterol Cal: 29 mg/dL (ref 5–40)

## 2018-07-08 ENCOUNTER — Ambulatory Visit: Payer: Self-pay | Attending: Family Medicine | Admitting: Pharmacist

## 2018-07-08 VITALS — BP 138/87 | HR 96

## 2018-07-08 DIAGNOSIS — I10 Essential (primary) hypertension: Secondary | ICD-10-CM | POA: Insufficient documentation

## 2018-07-08 DIAGNOSIS — E669 Obesity, unspecified: Secondary | ICD-10-CM

## 2018-07-08 DIAGNOSIS — R030 Elevated blood-pressure reading, without diagnosis of hypertension: Secondary | ICD-10-CM

## 2018-07-08 NOTE — Progress Notes (Addendum)
   S:    PCP: Zelda   Patient arrives in good spirits. Presents to the clinic for hypertension management. Patient was referred by Zelda on 06/24/18. BP 149/98 at that visit. No changes in medications made.   Today, pt denies chest pain, shortness of breath, headache or blurred vision.   Patient does not take antihypertensive medications.  Dietary habits include: does not limit salt, does not limit caffeine Exercise habits include: denies exercise Family / Social history: HTN (mother), never smoker, denies alcohol use  Home BP readings: does not check at home  O:  L arm after 5 minutes rest: 138/87, HR 96  Last 3 Office BP readings: BP Readings from Last 3 Encounters:  07/08/18 138/87  06/24/18 (!) 149/98  05/28/18 132/90   BMET    Component Value Date/Time   NA 143 06/24/2018 1711   K 4.3 06/24/2018 1711   CL 106 06/24/2018 1711   CO2 18 (L) 06/24/2018 1711   GLUCOSE 107 (H) 06/24/2018 1711   GLUCOSE 123 (H) 03/23/2017 2327   BUN 6 06/24/2018 1711   CREATININE 0.79 06/24/2018 1711   CREATININE 0.81 08/29/2016 1628   CALCIUM 9.2 06/24/2018 1711   GFRNONAA 90 06/24/2018 1711   GFRNONAA 89 08/29/2016 1628   GFRAA 104 06/24/2018 1711   GFRAA >89 08/29/2016 1628    Renal function: CrCl cannot be calculated (Unknown ideal weight.).  Clinical ASCVD: No  The 10-year ASCVD risk score Denman George(Goff DC Jr., et al., 2013) is: 0.8%   Values used to calculate the score:     Age: 47 years     Sex: Female     Is Non-Hispanic African American: No     Diabetic: No     Tobacco smoker: No     Systolic Blood Pressure: 138 mmHg     Is BP treated: No     HDL Cholesterol: 73 mg/dL     Total Cholesterol: 216 mg/dL   A/P: Hypertension undiagnosed. BP Goal <130/80 mmHg. Patient does not take antihypertensive medications. Presented patient with options including medications vs lifestyle. Pt opted to work on lifestyle and f/u with PCP. Made aware that we will start BP medications if  elevated at that visit.   -No medications added per patient request.  -Counseled on lifestyle modifications for blood pressure control including reduced dietary sodium, increased exercise, adequate sleep -HM: flu indicated; given  Results reviewed and written information provided.   Total time in face-to-face counseling 15 minutes.   F/U Clinic Visit w/ PCP.    Butch PennyLuke Van Ausdall, PharmD, CPP Clinical Pharmacist [facility] [phone]

## 2018-07-08 NOTE — Patient Instructions (Signed)
Thank you for coming to see [place] today.   Blood pressure today is elevated.  Limiting salt and caffeine, as well as exercising as able for at least 30 minutes for 5 days out of the week, can also help you lower your blood pressure.  Take your blood pressure at home if you are able. Please write down these numbers and bring them to your visits.  If you have any questions about medications, please call me [phone].  Luke    Plan de alimentacin DASH DASH Eating Plan DASH es la sigla en ingls de "Enfoques Alimentarios para Detener la Hipertensin" (Dietary Approaches to Stop Hypertension). El plan de alimentacin DASH ha demostrado bajar la presin arterial elevada (hipertensin). Tambin puede reducir [employer] de diabetes tipo 2, enfermedad cardaca y accidente cerebrovascular. Este plan tambin puede ayudar a Geophysical data processor. Consejos para seguir este plan [place]autas generales  Evite ingerir ms de 2,300 mg (miligramos) de sal (sodio) por da. Si tiene hipertensin, es posible que necesite reducir la ingesta de sodio a 1,500 mg por da.  Limite el consumo de alcohol a no ms de por da si es mujer y no est [place], y por da si es hombre. Una medida equivale a 12oz ( ) de cerveza, 5oz ( ) de vino o 1oz (44ml) de bebidas alcohlicas de alta graduacin.  Trabaje con su mdico para mantener un peso saludable o perder [employer]. Pregntele cul es el peso recomendado para usted.  Realice al menos 30 minutos de ejercicio que haga que se acelere su corazn (ejercicio Magazine features editor) la [employer] de la [place]. Estas actividades pueden incluir caminar, nadar o andar en bicicleta.  Trabaje con su mdico o especialista en alimentacin y nutricin (nutricionista) para ajustar su plan alimentario a sus necesidades calricas personales. Lectura de las etiquetas de los alimentos  Verifique en las etiquetas de los alimentos, la cantidad de sodio por porcin. Elija  alimentos con menos del 5 por ciento del valor diario de sodio. Generalmente, los alimentos con menos de 300 mg de sodio por porcin se encuadran dentro de este plan alimentario.  Para encontrar cereales integrales, busque la palabra "integral" como primera palabra en la lista de ingredientes. De compras  Compre productos en los que en su etiqueta diga: "bajo contenido de sodio" o "sin agregado de sal".  Compre alimentos frescos. Evite los alimentos enlatados y comidas precocidas o congeladas. Coccin  Evite agregar sal cuando cocine. Use hierbas o aderezos sin sal, en lugar de sal de mesa o sal marina. Consulte al mdico o farmacutico antes de usar sustitutos de la sal.  No fra los alimentos. A la hora de cocinar los alimentos opte por hornearlos, hervirlos, grillarlos y asarlos a Patent attorney.  Cocine con aceites cardiosaludables, como oliva, canola, soja o girasol. Planificacin de las comidas   Consuma una dieta equilibrada, que incluya lo siguiente: ? 5o ms porciones de frutas y Warehouse manager. Trate de que la mitad del plato de cada comida sean frutas y verduras. ? Hasta 6 u 8 porciones de cereales integrales por da. ? Menos de 6 onzas de carne, aves o pescado Copy. Una porcin de 3 onzas de carne tiene casi el mismo tamao que un mazo de cartas. Un huevo equivale a 1 onza. ? Dos porciones de productos lcteos descremados por Futures trader. ? Una porcin de frutos secos, semillas o frijoles 5 veces por semana. ? Grasas cardiosaludables. Las grasas saludables llamadas cidos grasos omega-3 se encuentran en alimentos  como semillas de lino y pescados de agua fra, como por ejemplo, sardinas, salmn y caballa.  Limite la cantidad que ingiere de los siguientes alimentos: ? Alimentos enlatados o envasados. ? Alimentos con alto contenido de grasa trans, como alimentos fritos. ? Alimentos con alto contenido de grasa saturada, como carne con grasa. ? Dulces, postres, bebidas  azucaradas y otros alimentos con azcar agregada. ? Productos lcteos enteros.  No le agregue sal a los alimentos antes de probarlos.  Trate de comer al menos 2 comidas vegetarianas por semana.  Consuma ms comida casera y menos de restaurante, de bufs y comida rpida.  Cuando coma en un restaurante, pida que preparen su comida con menos sal o, en lo posible, sin nada de sal. Qu alimentos se recomiendan? Los alimentos enumerados a continuacin no constituyen Water quality scientist. Hable con el nutricionista sobre las mejores opciones alimenticias para usted. Cereales Pan de salvado o integral. Pasta de salvado o integral. Arroz integral. Avena. Quinua. Trigo burgol. Cereales integrales y con bajo contenido de [place]. Pan pita. Galletitas de [place] con bajo contenido de [place] y [place]. Tortillas de [place] integral. Verduras Verduras frescas o congeladas (crudas, al vapor, asadas o grilladas). Jugos de tomate y verduras con bajo contenido de sodio o reducidos en sodio. Salsa y pasta de tomate con bajo contenido de sodio o reducidas en sodio. Verduras enlatadas con bajo contenido de sodio o reducidas en sodio. Frutas Todas las frutas frescas, congeladas o disecadas. Frutas enlatadas en jugo natural (sin agregado de azcar). Carne y otros alimentos proteicos Pollo o pavo sin piel. Carne de pollo o de Roann. Cerdo desgrasado. Pescado y [employer] Global. Claras de huevo. Porotos, guisantes o lentejas secos. Frutos secos, mantequilla de frutos secos y semillas sin sal. Frijoles enlatados sin sal. Cortes de carne vacuna magra, desgrasada. Embutidos magros, con bajo contenido de [place]. Lcteos Leche descremada (1%) o descremada. Quesos sin grasa, con bajo contenido de grasa o descremados. Queso blanco o ricota sin grasa, con bajo contenido de [place]. Yogur semidescremado o descremado. Queso con bajo contenido de [place] y [place]. Grasas y [employer] untables que no contengan grasas trans. Aceite  vegetal. Jerolyn Shin y aderezos para ensaladas livianos o con bajo contenido de grasas (reducidos en sodio). Aceite de canola, crtamo, oliva, soja y [place]. [place]. Condimentos y otros alimentos Hierbas. Especias. Mezclas de condimentos sin sal. Palomitas de maz y pretzels sin sal. Dulces con bajo contenido de grasas. Qu alimentos no se recomiendan? Los alimentos enumerados a continuacin no constituyen Water quality scientist. Hable con el nutricionista sobre las mejores opciones alimenticias para usted. Cereales Productos de panificacin hechos con grasa, como medialunas, magdalenas y algunos panes. Comidas con arroz o pasta seca listas para usar. Verduras Verduras con crema o fritas. Verduras en salsa de [place]. Verduras enlatadas regulares (que no sean con bajo contenido de sodio o reducidas en sodio). Pasta y salsa de tomates enlatadas regulares (que no sean con bajo contenido de sodio o reducidas en sodio). Jugos de tomate y verduras regulares (que no sean con bajo contenido de sodio o reducidos en sodio). Pepinillos. Aceitunas. Nils Pyle Fruta enlatada en almbar liviano o espeso. Frutas cocidas en aceite. Frutas con salsa de crema o [place]. Carne y otros alimentos proteicos Cortes de carne con grasa. Costillas. Carne frita. Tocino. Salchichas. Mortadela y otras carnes procesadas. Salame. Panceta. Perros calientes (hotdogs). Salchicha de cerdo. Frutos secos y semillas con sal. Frijoles enlatados con agregado de sal. Pescado enlatado o ahumado. Huevos enteros o yemas. Pollo o  pavo con piel. Lcteos Leche entera o al 2%, crema y [address] leche y mitad crema. Queso crema entero o con toda su grasa. Yogur entero o endulzado. Quesos con toda su grasa. Sustitutos de cremas no lcteas. Coberturas batidas. Quesos para untar y quesos procesados. Grasas y Barnes & Nobleaceites Mantequilla. Margarina en barra. Manteca de cerdo. Materia grasa. Mantequilla clarificada. Grasa de panceta. Aceites tropicales como aceite de coco,  palmiste o palma. Condimentos y otros alimentos Palomitas de maz y pretzels con sal. Sal de cebolla, sal de ajo, sal condimentada, sal de mesa y sal marina. Salsa Worcestershire. Salsa trtara. Salsa barbacoa. Salsa teriyaki. Salsa de soja, incluso la que tiene contenido reducido de Everettsodio. Salsa de carne. Salsas en lata y envasadas. Salsa de pescado. Salsa de Blue Bellostras. Salsa rosada. Rbano picante envasado. [place]. Mostaza. Saborizantes y tiernizantes para carne. Caldo en cubitos. Salsa picante y salsa tabasco. Escabeches envasados o ya preparados. Aderezos para tacos prefabricados o envasados. Salsas. Aderezos comunes para ensalada. Dnde encontrar ms informacin:  The Krogernstituto Nacional del [address], los Pulmones y Risk managerla Sang[place] (National Heart, Lung, and Blood Institute): PopSteam.iswww.nhlbi.nih.gov  Asociacin Estadounidense del Corazn (American Heart Association): www.heart.org Resumen  El plan de alimentacin DASH ha demostrado bajar la presin arterial elevada (hipertensin). Tambin puede reducir Lexmark Internationalel riesgo de diabetes tipo 2, enfermedad cardaca y accidente cerebrovascular.  Con el plan de alimentacin DASH, deber limitar el consumo de sal (sodio) a 2,300 mg por da. Si tiene hipertensin, es posible que necesite reducir la ingesta de sodio a 1,500 mg por da.  Cuando siga el plan de alimentacin DASH, trate de comer ms frutas frescas y verduras, cereales integrales, carnes magras, lcteos descremados y grasas cardiosaludables.  Trabaje con su mdico o especialista en alimentacin y nutricin (nutricionista) para ajustar su plan alimentario a sus necesidades calricas personales. Esta informacin no tiene Theme park managercomo fin reemplazar el consejo del mdico. Asegrese de hacerle al mdico cualquier pregunta que tenga. Document Released: 07/13/2011 Document Revised: 11/13/2016 Document Reviewed: 11/13/2016 Elsevier Interactive Patient Education  [employer]2018 Elsevier Inc.

## 2018-07-09 LAB — CBC
Hematocrit: 41.7 % (ref 34.0–46.6)
Hemoglobin: 13.7 g/dL (ref 11.1–15.9)
MCH: 29.8 pg (ref 26.6–33.0)
MCHC: 32.9 g/dL (ref 31.5–35.7)
MCV: 91 fL (ref 79–97)
PLATELETS: 247 10*3/uL (ref 150–450)
RBC: 4.59 x10E6/uL (ref 3.77–5.28)
RDW: 13 % (ref 12.3–15.4)
WBC: 7.2 10*3/uL (ref 3.4–10.8)

## 2018-07-10 ENCOUNTER — Telehealth: Payer: Self-pay

## 2018-07-10 NOTE — Telephone Encounter (Signed)
-----   Message from Claiborne RiggZelda W Fleming, NP sent at 07/02/2018  9:33 PM EST ----- Thyroid level is elevated. Please make a lab appointment in 4 weeks to have your thyroid level retested. Cholesterol levels are elevated. INSTRUCTIONS: Work on a low fat, heart healthy diet and participate in regular aerobic exercise program by working out at least 150 minutes per week; 5 days a week-30 minutes per day. Avoid red meat, fried foods. junk foods, sodas, sugary drinks, unhealthy snacking, alcohol and smoking.  Drink at least 48oz of water per day and monitor your carbohydrate intake daily.

## 2018-07-10 NOTE — Telephone Encounter (Signed)
CMA attempt to reach patient to inform on results.  No answer and left a VM.   Thyroid level is elevated. Please make a lab appointment in 4 weeks to have your thyroid level retested. Cholesterol levels are elevated. INSTRUCTIONS: Work on a low fat, heart healthy diet and participate in regular aerobic exercise program by working out at least 150 minutes per week; 5 days a week-30 minutes per day. Avoid red meat, fried foods. junk foods, sodas, sugary drinks, unhealthy snacking, alcohol and smoking.  Drink at least 48oz of water per day and monitor your carbohydrate intake daily.  A letter will be send out to reach patient.

## 2018-07-17 MED FILL — DICYCLOMINE 10 MG CAPSULE: 10 | 30 days supply | Qty: 90 | Fill #1

## 2018-07-17 MED FILL — DULoxetine HCL 60 MG CPEP: 60 | 30 days supply | Qty: 30 | Fill #1

## 2018-07-22 MED FILL — DULoxetine HCL 60 MG CPEP: 60 | 30 days supply | Qty: 30 | Fill #2

## 2018-09-09 ENCOUNTER — Ambulatory Visit: Payer: Self-pay | Attending: Nurse Practitioner | Admitting: Nurse Practitioner

## 2018-09-09 ENCOUNTER — Encounter: Payer: Self-pay | Admitting: Nurse Practitioner

## 2018-09-09 VITALS — BP 139/94 | HR 101 | Temp 99.6°F | Ht 61.0 in | Wt 181.8 lb

## 2018-09-09 DIAGNOSIS — F329 Major depressive disorder, single episode, unspecified: Secondary | ICD-10-CM

## 2018-09-09 DIAGNOSIS — F419 Anxiety disorder, unspecified: Secondary | ICD-10-CM

## 2018-09-09 DIAGNOSIS — R7989 Other specified abnormal findings of blood chemistry: Secondary | ICD-10-CM

## 2018-09-09 DIAGNOSIS — Z79899 Other long term (current) drug therapy: Secondary | ICD-10-CM

## 2018-09-09 MED ORDER — BUSPIRONE HCL 10 MG PO TABS: 10.0000 mg | ORAL_TABLET | Freq: Two times a day (BID) | ORAL | 2 refills | Status: AC

## 2018-09-09 MED ORDER — DULOXETINE HCL 60 MG PO CPEP
60.0000 mg | ORAL_CAPSULE | Freq: Every day | ORAL | 2 refills | Status: DC
Start: 2018-09-16 — End: 2019-12-11

## 2018-09-09 MED FILL — DULoxetine HCL 60 MG CPEP: 60 | 30 days supply | Qty: 30 | Fill #0

## 2018-09-09 MED FILL — busPIRone HCL 10 MG TABS: 10 | 30 days supply | Qty: 60 | Fill #0

## 2018-09-09 NOTE — Progress Notes (Signed)
Assessment & Plan:  Michele Vincent was seen today for follow-up.  Diagnoses and all orders for this visit:  Anxiety and depression -     DULoxetine (CYMBALTA) 60 MG capsule; Take 1 capsule (60 mg total) by mouth daily. -     busPIRone (BUSPAR) 10 MG tablet; Take 1 tablet (10 mg total) by mouth 2 (two) times daily. Continue all medications as prescribed.   Elevated TSH -     Thyroid Panel With TSH    Patient has been counseled on age-appropriate routine health concerns for screening and prevention. These are reviewed and up-to-date. Referrals have been placed accordingly. Immunizations are up-to-date or declined.    Subjective:   Chief Complaint  Patient presents with  . Follow-up    Pt. is here to follow-up on Anxiety and depression.    HPI Michele Vincent 48 y.o. female presents to office today for follow up to anxiety and depression.  VRI was used to communicate directly with patient for the entire encounter including providing detailed patient instructions.    Anxiety and Depression Chronic. She is currently taking Cymbalta 60mg  daily and Buspar 10mg  BID. Her PHQ9 score is the same as November. Her TSH was slightly elevated in November but not concerning for true hypothyroidism at that time. Will recheck today.  She denies thoughts of self harm however when I initially questioned her regarding this she hesitated. I then asked her if she wanted her partner to leave the room or was she sure she was not having thoughts of self harm. She answered and states she is sure she is not having thoughts of self harm.  Depression screen [facility] 2/9 09/09/2018 06/24/2018 11/22/2016 08/29/2016 10/26/2014  Decreased Interest 1 1 1  0 0  Down, Depressed, Hopeless 1 2 - 0 0  PHQ - 2 Score 2 3 1  0 0  Altered sleeping 2 1 1 3  -  Tired, decreased energy 1 0 1 0 -  Change in appetite 0 0 - 0 -  Feeling bad or failure about yourself  0 0 - 0 -  Trouble concentrating 0 1 1 0 -  Moving slowly or  fidgety/restless 0 - - 0 -  Suicidal thoughts 0 0 - 0 -  PHQ-9 Score 5 5 4 3  -    Review of Systems  Constitutional: Negative for fever, malaise/fatigue and weight loss.  HENT: Negative.  Negative for nosebleeds.   Eyes: Negative.  Negative for blurred vision, double vision and photophobia.  Respiratory: Negative.  Negative for cough and shortness of breath.   Cardiovascular: Negative.  Negative for chest pain, palpitations and leg swelling.  Gastrointestinal: Negative.  Negative for heartburn, nausea and vomiting.  Musculoskeletal: Negative.  Negative for myalgias.  Neurological: Negative.  Negative for dizziness, focal weakness, seizures and headaches.  Psychiatric/Behavioral: Positive for depression. Negative for suicidal ideas. The patient is nervous/anxious.     Past Medical History:  Diagnosis Date  . Anxiety   . Cholelithiases   . Colitis   . Depression   . Diverticulitis   . Diverticulosis   . Fatty liver   . Gastritis   . IBS (irritable bowel syndrome)     Past Surgical History:  Procedure Laterality Date  . COLONOSCOPY    . NO PAST SURGERIES      Family History  Problem Relation Age of Onset  . CVA Mother   . Hypertension Mother   . Pneumonia Father   . Colon cancer Neg Hx  Social History Reviewed with no changes to be made today.   Outpatient Medications Prior to Visit  Medication Sig Dispense Refill  . dicyclomine (BENTYL) 10 MG capsule Take 1 capsule (10 mg total) by mouth 3 (three) times daily as needed (crampy abdominal pain). 90 capsule 1  . Multiple Minerals (CALCIUM/MAGNESIUM/ZINC PO) Take by mouth.    . busPIRone (BUSPAR) 10 MG tablet Take 1 tablet (10 mg total) by mouth 2 (two) times daily. 180 tablet 2  . DULoxetine (CYMBALTA) 60 MG capsule Take 1 capsule (60 mg total) by mouth daily. 90 capsule 2   No facility-administered medications prior to visit.     No Known Allergies     Objective:    BP (!) 139/94 (BP Location: Left Arm,  Patient Position: Sitting, Cuff Size: Normal)   Pulse (!) 101   Temp 99.6 F (37.6 C) (Oral)   Ht 5\' 1"  (1.549 m)   Wt 181 lb 12.8 oz (82.5 kg)   LMP 09/08/2018   SpO2 98%   BMI 34.35 kg/m  Wt Readings from Last 3 Encounters:  09/09/18 181 lb 12.8 oz (82.5 kg)  06/24/18 180 lb (81.6 kg)  05/28/18 179 lb (81.2 kg)    Physical Exam Vitals signs and nursing note reviewed.  Constitutional:      Appearance: She is well-developed.  HENT:     Head: Normocephalic and atraumatic.  Neck:     Musculoskeletal: Normal range of motion.  Cardiovascular:     Rate and Rhythm: Normal rate and regular rhythm.     Heart sounds: Normal heart sounds. No murmur. No friction rub. No gallop.   Pulmonary:     Effort: Pulmonary effort is normal. No tachypnea or respiratory distress.     Breath sounds: Normal breath sounds. No decreased breath sounds, wheezing, rhonchi or rales.  Chest:     Chest wall: No tenderness.  Abdominal:     General: Bowel sounds are normal.     Palpations: Abdomen is soft.  Musculoskeletal: Normal range of motion.  Skin:    General: Skin is warm and dry.  Neurological:     Mental Status: She is alert and oriented to person, place, and time.     Coordination: Coordination normal.  Psychiatric:        Attention and Perception: Attention normal.        Mood and Affect: Mood normal.        Speech: Speech normal.        Behavior: Behavior normal. Behavior is cooperative.        Thought Content: Thought content normal.        Cognition and Memory: Cognition normal.        Judgment: Judgment normal.        Patient has been counseled extensively about nutrition and exercise as well as the importance of adherence with medications and regular follow-up. The patient was given clear instructions to go to ER or return to medical center if symptoms don't improve, worsen or new problems develop. The patient verbalized understanding.   Follow-up: Return in about 3 months (around  12/08/2018) for Depression and Anxiety.   Claiborne Rigg, FNP-BC [facility] [place], [place] [phone]   09/09/2018, 4:07 PM

## 2018-09-09 NOTE — Patient Instructions (Signed)
Behavioral Health Resources:  ? ?What if I or someone I know is in crisis? ? ?If you are thinking about harming yourself or having thoughts of suicide, or if you know someone who is, seek help right away. ? ?Call your doctor or mental health care provider. ? ?Call 911 or go to a hospital emergency room to get immediate help, or ask a friend or family member to help you do these things. ? ?Call the [place] National Suicide Prevention Lifeline?s toll-free, 24-hour hotline at 1-800-273-TALK (1-800-273-8255) or TTY: 1-800-799-4 TTY (1-800-799-4889) to talk to a trained counselor. ? ?If you are in crisis, make sure you are not left alone.  ? ?If someone else is in crisis, make sure he or she is not left alone ? ? ?24 Hour Availability ? ?[facility]  ?[address], [place], [place] [address]  ?336-832-9700 or 1-800-711-2635 ? ?[facility] Crisis Line ?(Domestic Violence, Rape & Victim Assistance ?336-273-7273 ? ?[facility]  ?[address] [place], [place]  [address]               1-855-788-8787 or 336-676-6840 ? ?[facility] [place] [facility]    ?(ONLY from 8am-4pm)    ?336-899-1505 ? ?[facility] (24/7)   ?1-877-626-1772 ? ?[place] National Suicide Hotline   ?1-800-273-8255 (TALK) ? ?Support from local police to aid getting patient to hospital (http://www.[place].gov/index.aspx?page=2797) ? ? ?      ?ONGOING BEHAVIORAL HEALTH SUPPORT FOR UNINSURED and UNDERINSURED:  ?[facility]  ?336-676-6840  ?[address]  ?Walk-in first time, Monday-Friday, 8:30am-5:00pm  ?*Bring snack, drink, something to do, long wait at first visit, they do have pharmacy for behavioral health medications/ Bring own interpreter at first visit, if needed ?[facility]  ?336-387-6161  ?[address]  ?Walk-in Monday-Friday, 8:30am-12pm & 1-2:30pm  ?*pacientes que hablen espanol, favor comunicarse con el Sr.  [place], extension 2244 o la [address], extension 3331 para hacer una cita  ?[facility]:  ?336-429-5600 or [email or web address]@gmail.[email or web address]  ?[address], [address]  ?Call or email, may self-refer  ?* uninsured/underinsured, 19-64yo, have both mental health and substance use challenges  ?[facility]:  ?Phone [phone]; Fax [phone]  ?*Call to schedule an appointment  ?3rd Floor located @?[address]  ?Mon-Thursday: 8:30am-8:00pm Friday: 8:30am-7:00pm  ?* Be sure to park in a space labeled ?Psychology Department,? located to the right of the main door of the building. Enter the main doors facing the parking lot and take the elevator or stairs to the 3rd Floor.  ?[facility]:  ?[phone] or  ?1-800-711-2635 (24/hour helpline)  ?[address]  ?Call to make appointment, tends to be a long wait to begin services, depending on insurance  ?[facility]  ?(336) [phone] ??  ?*Call to schedule an appointment  ?[address]  ?Monday-Friday, 8:00am-5:00pm  ?[facility]  ?[phone]  ?[address], [place]  ?Monday-Friday, walk-in 8am-3pm  ?First appointment is assessment, then will make appointment for psychiatry   ?  ?

## 2018-09-10 LAB — THYROID PANEL WITH TSH
Free Thyroxine Index: 1.5 (ref 1.2–4.9)
T3 Uptake Ratio: 23 % — ABNORMAL LOW (ref 24–39)
T4, Total: 6.5 ug/dL (ref 4.5–12.0)
TSH: 2.41 u[IU]/mL (ref 0.450–4.500)

## 2018-09-12 ENCOUNTER — Telehealth: Payer: Self-pay

## 2018-09-12 NOTE — Telephone Encounter (Signed)
-----   Message from Claiborne Rigg, NP sent at 09/11/2018 11:25 PM EST ----- Thyroid levels are normal

## 2018-09-12 NOTE — Telephone Encounter (Signed)
CMA attempt to reach patient to inform on results.  No answer and left a VM for patient.  

## 2018-10-01 MED FILL — busPIRone HCL 10 MG TABS: 10 | 30 days supply | Qty: 60 | Fill #0

## 2018-10-01 MED FILL — DULoxetine HCL 60 MG CPEP: 60 | 30 days supply | Qty: 30 | Fill #0

## 2018-11-08 MED FILL — DULoxetine HCL 60 MG CPEP: 60 | 30 days supply | Qty: 30 | Fill #1

## 2018-11-08 MED FILL — busPIRone HCL 10 MG TABS: 10 | 30 days supply | Qty: 60 | Fill #1

## 2019-01-08 MED FILL — DULoxetine HCL 60 MG CPEP: 60 | 30 days supply | Qty: 30 | Fill #2

## 2019-01-08 MED FILL — busPIRone HCL 10 MG TABS: 10 | 30 days supply | Qty: 60 | Fill #2

## 2019-01-30 MED FILL — busPIRone HCL 10 MG TABS: 10 | 30 days supply | Qty: 60 | Fill #2

## 2019-01-30 MED FILL — DULoxetine HCL 60 MG CPEP: 60 | 30 days supply | Qty: 30 | Fill #2

## 2019-02-13 MED FILL — busPIRone HCL 10 MG TABS: 10 | 30 days supply | Qty: 60 | Fill #2

## 2019-02-13 MED FILL — DULoxetine HCL 60 MG CPEP: 60 | 30 days supply | Qty: 30 | Fill #2

## 2019-03-04 ENCOUNTER — Inpatient Hospital Stay (HOSPITAL_COMMUNITY)
Admission: AD | Admit: 2019-03-04 | Discharge: 2019-03-04 | Disposition: A | Discharge status: Other Acute Inpatient Hospital | Payer: Self-pay | Attending: Obstetrics and Gynecology | Admitting: Obstetrics and Gynecology

## 2019-04-28 MED FILL — busPIRone HCL 10 MG TABS: 10 | 30 days supply | Qty: 60 | Fill #3

## 2019-04-28 MED FILL — DULoxetine HCL 60 MG CPEP: 60 | 30 days supply | Qty: 30 | Fill #3

## 2019-11-24 ENCOUNTER — Other Ambulatory Visit: Payer: Self-pay | Admitting: Nurse Practitioner

## 2019-11-24 DIAGNOSIS — F419 Anxiety disorder, unspecified: Secondary | ICD-10-CM

## 2019-11-24 DIAGNOSIS — F329 Major depressive disorder, single episode, unspecified: Secondary | ICD-10-CM

## 2019-11-25 ENCOUNTER — Ambulatory Visit: Payer: Self-pay | Attending: Internal Medicine | Admitting: Internal Medicine

## 2019-11-25 ENCOUNTER — Other Ambulatory Visit: Payer: Self-pay

## 2019-12-11 ENCOUNTER — Encounter: Payer: Self-pay | Admitting: Family Medicine

## 2019-12-11 ENCOUNTER — Ambulatory Visit: Payer: Self-pay | Attending: Family Medicine | Admitting: Family Medicine

## 2019-12-11 ENCOUNTER — Other Ambulatory Visit: Payer: Self-pay

## 2019-12-11 DIAGNOSIS — G4709 Other insomnia: Secondary | ICD-10-CM

## 2019-12-11 DIAGNOSIS — F329 Major depressive disorder, single episode, unspecified: Secondary | ICD-10-CM

## 2019-12-11 DIAGNOSIS — F419 Anxiety disorder, unspecified: Secondary | ICD-10-CM

## 2019-12-11 MED ORDER — TRAZODONE HCL 50 MG PO TABS: 50.0000 mg | ORAL_TABLET | Freq: Every day | ORAL | 3 refills | Status: DC

## 2019-12-11 MED ORDER — DULOXETINE HCL 60 MG PO CPEP: 60.0000 mg | ORAL_CAPSULE | Freq: Every day | ORAL | 1 refills | Status: DC

## 2019-12-11 MED FILL — traZODone HCL 50 MG TABS: 50 | 30 days supply | Qty: 30 | Fill #0

## 2019-12-11 MED FILL — DULoxetine HCL 60 MG CPEP: 60 | 90 days supply | Qty: 90 | Fill #0

## 2019-12-11 NOTE — Progress Notes (Signed)
Has not had depression medication in months due to the pandemic.

## 2019-12-11 NOTE — Progress Notes (Signed)
Virtual Visit via Telephone Note  I connected with Michele Vincent, on 12/11/2019 at 9:45 AM by telephone due to the COVID-19 pandemic and verified that I am speaking with the correct person using two identifiers.   Consent: I discussed the limitations, risks, security and privacy concerns of performing an evaluation and management service by telephone and the availability of in person appointments. I also discussed with the patient that there may be a patient responsible charge related to this service. The patient expressed understanding and agreed to proceed.   Location of Patient: Home  Location of Provider: Clinic   Persons participating in Telemedicine visit: Michele Vincent #[id number] Dr. Alvis Lemmings     History of Present Illness: 49 year old female patient of Michele Denver FNP with a history of Anxiety and Depression seen for a follow up visit; last visit with PCP was on 09/09/18.  She states she is not doing well as she has been out of her Cymbalta and has been more Depressed but denies suicidal ideations or intents. She also complains of insomnia which has been present for years and she has never received treatment for.  Denies caffeine intake and does not take daytime naps.  Past Medical History:  Diagnosis Date  . Anxiety   . Cholelithiases   . Colitis   . Depression   . Diverticulitis   . Diverticulosis   . Fatty liver   . Gastritis   . IBS (irritable bowel syndrome)    No Known Allergies  Current Outpatient Medications on File Prior to Visit  Medication Sig Dispense Refill  . dicyclomine (BENTYL) 10 MG capsule Take 1 capsule (10 mg total) by mouth 3 (three) times daily as needed (crampy abdominal pain). 90 capsule 1  . DULoxetine (CYMBALTA) 60 MG capsule Take 1 capsule (60 mg total) by mouth daily. 90 capsule 2  . Multiple Minerals (CALCIUM/MAGNESIUM/ZINC PO) Take by mouth.     No current facility-administered medications  on file prior to visit.    Observations/Objective: Alert, awake, oriented x3 No acute distress  Assessment and Plan: 1. Anxiety and depression Uncontrolled due to running out of medications which I have refilled - DULoxetine (CYMBALTA) 60 MG capsule; Take 1 capsule (60 mg total) by mouth daily.  Dispense: 90 capsule; Refill: 1  2. Other insomnia Discussed sleep hygiene, avoiding caffeine, avoiding daytime naps Late afternoon exercises might be beneficial - traZODone (DESYREL) 50 MG tablet; Take 1 tablet (50 mg total) by mouth at bedtime.  Dispense: 30 tablet; Refill: 3   Follow Up Instructions: 3 months with PCP   I discussed the assessment and treatment plan with the patient. The patient was provided an opportunity to ask questions and all were answered. The patient agreed with the plan and demonstrated an understanding of the instructions.   The patient was advised to call back or seek an in-person evaluation if the symptoms worsen or if the condition fails to improve as anticipated.     I provided 13 minutes total of non-face-to-face time during this encounter including median intraservice time, reviewing previous notes, investigations, ordering medications, medical decision making, coordinating care and patient verbalized understanding at the end of the visit.     Hoy Register, MD, FAAFP. [facility] [place], [place] [phone]   12/11/2019, 9:45 AM

## 2020-04-07 ENCOUNTER — Ambulatory Visit: Payer: Self-pay | Attending: Nurse Practitioner | Admitting: Nurse Practitioner

## 2020-04-07 ENCOUNTER — Encounter: Payer: Self-pay | Admitting: Nurse Practitioner

## 2020-04-07 ENCOUNTER — Other Ambulatory Visit: Payer: Self-pay | Admitting: Nurse Practitioner

## 2020-04-07 DIAGNOSIS — F32A Depression, unspecified: Secondary | ICD-10-CM

## 2020-04-07 DIAGNOSIS — F419 Anxiety disorder, unspecified: Secondary | ICD-10-CM

## 2020-04-07 DIAGNOSIS — G4709 Other insomnia: Secondary | ICD-10-CM

## 2020-04-07 DIAGNOSIS — F329 Major depressive disorder, single episode, unspecified: Secondary | ICD-10-CM

## 2020-04-07 MED ORDER — DULOXETINE HCL 60 MG PO CPEP: 60.0000 mg | ORAL_CAPSULE | Freq: Every day | ORAL | 1 refills | Status: DC

## 2020-04-07 MED ORDER — TRAZODONE HCL 50 MG PO TABS: 50.0000 mg | ORAL_TABLET | Freq: Every day | ORAL | 1 refills | Status: DC

## 2020-04-07 MED FILL — DULoxetine HCL 60 MG CPEP: 60 | 90 days supply | Qty: 90 | Fill #1

## 2020-04-07 MED FILL — traZODone HCL 50 MG TABS: 50 | 30 days supply | Qty: 30 | Fill #1

## 2020-04-07 NOTE — Progress Notes (Signed)
Virtual Visit via Telephone Note Due to national recommendations of social distancing due to COVID 19, telehealth visit is felt to be most appropriate for this patient at this time.  I discussed the limitations, risks, security and privacy concerns of performing an evaluation and management service by telephone and the availability of in person appointments. I also discussed with the patient that there may be a patient responsible charge related to this service. The patient expressed understanding and agreed to proceed.    I connected with Oneita Hurt on 04/07/20  at  10:30 AM EDT  EDT by telephone and verified that I am speaking with the correct person using two identifiers.   Consent I discussed the limitations, risks, security and privacy concerns of performing an evaluation and management service by telephone and the availability of in person appointments. I also discussed with the patient that there may be a patient responsible charge related to this service. The patient expressed understanding and agreed to proceed.   Location of Patient: Private Residence   [facility] of Provider: [facility]    Persons participating in Telemedicine visit: Bertram Denver FNP-BC YY [place] CMA Sadia Vincente [place]  Spanish Interpreter [place] ID# [id number]   History of Present Illness: Telemedicine visit for:  Medication refills  has a past medical history of Anxiety, Cholelithiases, Colitis, Depression, Diverticulitis, Diverticulosis, Fatty liver, Gastritis, and IBS (irritable bowel syndrome).  She takes trazodone for insomnia and cymbalta for depression and anxiety. Denies any thoughts of self harm. Symptoms are well controlled.  Depression screen [facility] 2/9 04/07/2020 12/11/2019 09/09/2018 06/24/2018 11/22/2016  Decreased Interest 0 1 1 1 1   Down, Depressed, Hopeless 0 1 1 2  -  PHQ - 2 Score 0 2 2 3 1   Altered sleeping 0 1 2 1 1   Tired, decreased energy 0 1 1 0 1  Change  in appetite 0 1 0 0 -  Feeling bad or failure about yourself  0 1 0 0 -  Trouble concentrating 0 1 0 1 1  Moving slowly or fidgety/restless 0 1 0 - -  Suicidal thoughts 0 0 0 0 -  PHQ-9 Score 0 8 5 5 4    GAD 7 : Generalized Anxiety Score 04/07/2020 12/11/2019 09/09/2018 06/24/2018  Nervous, Anxious, on Edge 0 1 2 2   Control/stop worrying 0 1 2 1   Worry too much - different things 0 1 2 -  Trouble relaxing 0 1 2 1   Restless 0 1 1 1   Easily annoyed or irritable 0 1 1 1   Afraid - awful might happen 0 1 2 1   Total GAD 7 Score 0 7 12 -     Past Medical History:  Diagnosis Date  . Anxiety   . Cholelithiases   . Colitis   . Depression   . Diverticulitis   . Diverticulosis   . Fatty liver   . Gastritis   . IBS (irritable bowel syndrome)     Past Surgical History:  Procedure Laterality Date  . COLONOSCOPY    . NO PAST SURGERIES      Family History  Problem Relation Age of Onset  . CVA Mother   . Hypertension Mother   . Pneumonia Father   . Colon cancer Neg Hx     Social History   Socioeconomic History  . Marital status: Single    Spouse name: Not on file  . Number of children: 1  . Years of education: Not on file  . Highest  education level: Not on file  Occupational History  . Occupation: Unemployed  Tobacco Use  . Smoking status: Never Smoker  . Smokeless tobacco: Never Used  Vaping Use  . Vaping Use: Never used  Substance and Sexual Activity  . Alcohol use: No  . Drug use: No  . Sexual activity: Yes    Birth control/protection: None  Other Topics Concern  . Not on file  Social History Narrative   ** Merged History Encounter **       Social Determinants of Health   Financial Resource Strain:   . Difficulty of Paying Living Expenses: Not on file  Food Insecurity:   . Worried About Programme researcher, broadcasting/film/video in the Last Year: Not on file  . Ran Out of Food in the Last Year: Not on file  Transportation Needs:   . Lack of Transportation (Medical): Not on file  .  Lack of Transportation (Non-Medical): Not on file  Physical Activity:   . Days of Exercise per Week: Not on file  . Minutes of Exercise per Session: Not on file  Stress:   . Feeling of Stress : Not on file  Social Connections:   . Frequency of Communication with Friends and Family: Not on file  . Frequency of Social Gatherings with Friends and Family: Not on file  . Attends Religious Services: Not on file  . Active Member of Clubs or Organizations: Not on file  . Attends Banker Meetings: Not on file  . Marital Status: Not on file     Observations/Objective: Awake, alert and oriented x 3   Review of Systems  Constitutional: Negative for fever, malaise/fatigue and weight loss.  HENT: Negative.  Negative for nosebleeds.   Eyes: Negative.  Negative for blurred vision, double vision and photophobia.  Respiratory: Negative.  Negative for cough and shortness of breath.   Cardiovascular: Negative.  Negative for chest pain, palpitations and leg swelling.  Gastrointestinal: Negative.  Negative for heartburn, nausea and vomiting.  Musculoskeletal: Negative.  Negative for myalgias.  Neurological: Negative.  Negative for dizziness, focal weakness, seizures and headaches.  Psychiatric/Behavioral: Positive for depression. Negative for suicidal ideas. The patient is nervous/anxious and has insomnia.     Assessment and Plan: Seline was seen today for medication refill.  Diagnoses and all orders for this visit:  Anxiety and depression -     DULoxetine (CYMBALTA) 60 MG capsule; Take 1 capsule (60 mg total) by mouth daily.  Other insomnia -     traZODone (DESYREL) 50 MG tablet; Take 1 tablet (50 mg total) by mouth at bedtime.     Follow Up Instructions Return in about 3 months (around 07/07/2020).     I discussed the assessment and treatment plan with the patient. The patient was provided an opportunity to ask questions and all were answered. The patient agreed with the plan and  demonstrated an understanding of the instructions.   The patient was advised to call back or seek an in-person evaluation if the symptoms worsen or if the condition fails to improve as anticipated.  I provided 14 minutes of non-face-to-face time during this encounter including median intraservice time, reviewing previous notes, labs, imaging, medications and explaining diagnosis and management.  Claiborne Rigg, FNP-BC

## 2020-04-19 ENCOUNTER — Encounter (INDEPENDENT_AMBULATORY_CARE_PROVIDER_SITE_OTHER): Payer: Self-pay

## 2020-04-19 ENCOUNTER — Other Ambulatory Visit: Payer: Self-pay

## 2020-04-19 ENCOUNTER — Other Ambulatory Visit: Payer: Self-pay | Admitting: Nurse Practitioner

## 2020-04-19 ENCOUNTER — Ambulatory Visit: Payer: Self-pay | Attending: Nurse Practitioner

## 2020-04-19 DIAGNOSIS — R7989 Other specified abnormal findings of blood chemistry: Secondary | ICD-10-CM

## 2020-04-19 DIAGNOSIS — E669 Obesity, unspecified: Secondary | ICD-10-CM

## 2020-04-19 DIAGNOSIS — Z130 Encounter for screening for diseases of the blood and blood-forming organs and certain disorders involving the immune mechanism: Secondary | ICD-10-CM

## 2020-04-20 LAB — CBC
Hematocrit: 40.3 % (ref 34.0–46.6)
Hemoglobin: 13 g/dL (ref 11.1–15.9)
MCH: 29.9 pg (ref 26.6–33.0)
MCHC: 32.3 g/dL (ref 31.5–35.7)
MCV: 93 fL (ref 79–97)
Platelets: 231 10*3/uL (ref 150–450)
RBC: 4.35 x10E6/uL (ref 3.77–5.28)
RDW: 13.1 % (ref 11.7–15.4)
WBC: 7.3 10*3/uL (ref 3.4–10.8)

## 2020-04-20 LAB — CMP14+EGFR
ALT: 17 IU/L (ref 0–32)
AST: 19 IU/L (ref 0–40)
Albumin/Globulin Ratio: 1.7 (ref 1.2–2.2)
Albumin: 4.5 g/dL (ref 3.8–4.8)
Alkaline Phosphatase: 80 IU/L (ref 44–121)
BUN/Creatinine Ratio: 11 (ref 9–23)
BUN: 8 mg/dL (ref 6–24)
Bilirubin Total: 0.3 mg/dL (ref 0.0–1.2)
CO2: 23 mmol/L (ref 20–29)
Calcium: 9.3 mg/dL (ref 8.7–10.2)
Chloride: 105 mmol/L (ref 96–106)
Creatinine, Ser: 0.7 mg/dL (ref 0.57–1.00)
GFR calc Af Amer: 118 mL/min/{1.73_m2} (ref >59)
GFR calc non Af Amer: 103 mL/min/{1.73_m2} (ref >59)
Globulin, Total: 2.7 g/dL (ref 1.5–4.5)
Glucose: 100 mg/dL — ABNORMAL HIGH (ref 65–99)
Potassium: 4.3 mmol/L (ref 3.5–5.2)
Sodium: 141 mmol/L (ref 134–144)
Total Protein: 7.2 g/dL (ref 6.0–8.5)

## 2020-04-20 LAB — THYROID PANEL WITH TSH
Free Thyroxine Index: 1.6 (ref 1.2–4.9)
T3 Uptake Ratio: 23 % — ABNORMAL LOW (ref 24–39)
T4, Total: 7 ug/dL (ref 4.5–12.0)
TSH: 3.12 u[IU]/mL (ref 0.450–4.500)

## 2020-04-20 LAB — LIPID PANEL
Chol/HDL Ratio: 2.9 ratio (ref 0.0–4.4)
Cholesterol, Total: 205 mg/dL — ABNORMAL HIGH (ref 100–199)
HDL: 70 mg/dL (ref >39)
LDL Chol Calc (NIH): 115 mg/dL — ABNORMAL HIGH (ref 0–99)
Triglycerides: 113 mg/dL (ref 0–149)
VLDL Cholesterol Cal: 20 mg/dL (ref 5–40)

## 2020-04-20 LAB — HEMOGLOBIN A1C
Est. average glucose Bld gHb Est-mCnc: 108 mg/dL
Hgb A1c MFr Bld: 5.4 % (ref 4.8–5.6)

## 2020-05-03 ENCOUNTER — Ambulatory Visit: Payer: Self-pay | Attending: Nurse Practitioner | Admitting: Nurse Practitioner

## 2020-05-03 ENCOUNTER — Encounter: Payer: Self-pay | Admitting: Nurse Practitioner

## 2020-05-03 ENCOUNTER — Other Ambulatory Visit: Payer: Self-pay

## 2020-05-03 VITALS — BP 142/85 | HR 98 | Temp 97.7°F | Ht 61.0 in | Wt 181.0 lb

## 2020-05-03 DIAGNOSIS — Z114 Encounter for screening for human immunodeficiency virus [HIV]: Secondary | ICD-10-CM

## 2020-05-03 DIAGNOSIS — Z1159 Encounter for screening for other viral diseases: Secondary | ICD-10-CM

## 2020-05-03 DIAGNOSIS — Z124 Encounter for screening for malignant neoplasm of cervix: Secondary | ICD-10-CM

## 2020-05-03 NOTE — Progress Notes (Signed)
Assessment & Plan:  Michele Vincent was seen today for gynecologic exam.  Diagnoses and all orders for this visit:  Encounter for Papanicolaou smear for cervical cancer screening -     Cytology - PAP -     Cervicovaginal ancillary only  Need for hepatitis C screening test -     Hepatitis C Antibody  Encounter for screening for HIV -     HIV antibody (with reflex)    Patient has been counseled on age-appropriate routine health concerns for screening and prevention. These are reviewed and up-to-date. Referrals have been placed accordingly. Immunizations are up-to-date or declined.    Subjective:   Chief Complaint  Patient presents with  . Gynecologic Exam    Pt. is here for a pap smear .   HPI Michele Vincent 49 y.o. female presents to office today for PAP smear.    Review of Systems  Constitutional: Negative.  Negative for chills, fever, malaise/fatigue and weight loss.  Respiratory: Negative.  Negative for cough, shortness of breath and wheezing.   Cardiovascular: Negative.  Negative for chest pain, orthopnea and leg swelling.  Gastrointestinal: Negative for abdominal pain.  Genitourinary: Negative.  Negative for flank pain.  Skin: Negative.  Negative for rash.  Psychiatric/Behavioral: Negative for suicidal ideas.    Past Medical History:  Diagnosis Date  . Anxiety   . Cholelithiases   . Colitis   . Depression   . Diverticulitis   . Diverticulosis   . Fatty liver   . Gastritis   . IBS (irritable bowel syndrome)     Past Surgical History:  Procedure Laterality Date  . COLONOSCOPY    . NO PAST SURGERIES      Family History  Problem Relation Age of Onset  . CVA Mother   . Hypertension Mother   . Pneumonia Father   . Colon cancer Neg Hx     Social History Reviewed with no changes to be made today.   Outpatient Medications Prior to Visit  Medication Sig Dispense Refill  . dicyclomine (BENTYL) 10 MG capsule Take 1 capsule (10 mg total) by mouth 3 (three)  times daily as needed (crampy abdominal pain). 90 capsule 1  . DULoxetine (CYMBALTA) 60 MG capsule Take 1 capsule (60 mg total) by mouth daily. 90 capsule 1  . Multiple Minerals (CALCIUM/MAGNESIUM/ZINC PO) Take by mouth.    . traZODone (DESYREL) 50 MG tablet Take 1 tablet (50 mg total) by mouth at bedtime. 90 tablet 1   No facility-administered medications prior to visit.    No Known Allergies     Objective:    BP (!) 142/85 (BP Location: Left Arm, Patient Position: Sitting, Cuff Size: Normal)   Pulse 98   Temp 97.7 F (36.5 C) (Temporal)   Ht 5\' 1"  (1.549 m)   Wt 181 lb (82.1 kg)   LMP 04/11/2020   SpO2 100%   BMI 34.20 kg/m  Wt Readings from Last 3 Encounters:  05/03/20 181 lb (82.1 kg)  09/09/18 181 lb 12.8 oz (82.5 kg)  06/24/18 180 lb (81.6 kg)    Physical Exam Exam conducted with a chaperone present.  Constitutional:      Appearance: She is well-developed.  HENT:     Head: Normocephalic.  Cardiovascular:     Rate and Rhythm: Normal rate and regular rhythm.     Heart sounds: Normal heart sounds.  Pulmonary:     Effort: Pulmonary effort is normal.     Breath sounds: Normal breath sounds.  Chest:     Chest wall: No mass.     Breasts: Breasts are symmetrical.        Right: Normal.        Left: Normal.  Abdominal:     General: Bowel sounds are normal.     Palpations: Abdomen is soft.     Hernia: There is no hernia in the left inguinal area.  Genitourinary:    Exam position: Lithotomy position.     Labia:        Right: No rash, tenderness, lesion or injury.        Left: No rash, tenderness, lesion or injury.      Vagina: Normal. No signs of injury and foreign body. No vaginal discharge, erythema, tenderness or bleeding.     Cervix: No cervical motion tenderness or friability.     Uterus: Not deviated and not enlarged.      Adnexa:        Right: No mass, tenderness or fullness.         Left: No mass, tenderness or fullness.       Rectum: Normal. No external  hemorrhoid.  Lymphadenopathy:     Lower Body: No right inguinal adenopathy. No left inguinal adenopathy.  Skin:    General: Skin is warm and dry.  Neurological:     Mental Status: She is alert and oriented to person, place, and time.  Psychiatric:        Behavior: Behavior normal.        Thought Content: Thought content normal.        Judgment: Judgment normal.          Patient has been counseled extensively about nutrition and exercise as well as the importance of adherence with medications and regular follow-up. The patient was given clear instructions to go to ER or return to medical center if symptoms don't improve, worsen or new problems develop. The patient verbalized understanding.   Follow-up: Return if symptoms worsen or fail to improve.   Claiborne Rigg, FNP-BC [facility] [place], [place] [phone]   05/03/2020, 12:33 PM

## 2020-05-04 LAB — HEPATITIS C ANTIBODY: Hep C Virus Ab: <0.1 s/co ratio (ref 0.0–0.9)

## 2020-05-04 LAB — CERVICOVAGINAL ANCILLARY ONLY
Bacterial Vaginitis (gardnerella): NEGATIVE
Candida Glabrata: NEGATIVE
Candida Vaginitis: POSITIVE — AB
Chlamydia: NEGATIVE
Comment: NEGATIVE
Comment: NEGATIVE
Comment: NEGATIVE
Comment: NEGATIVE
Comment: NEGATIVE
Comment: NORMAL
Neisseria Gonorrhea: NEGATIVE
Trichomonas: NEGATIVE

## 2020-05-04 LAB — HIV ANTIBODY (ROUTINE TESTING W REFLEX): HIV Screen 4th Generation wRfx: NONREACTIVE

## 2020-05-06 LAB — CYTOLOGY - PAP
Comment: NEGATIVE
Diagnosis: NEGATIVE
Diagnosis: REACTIVE
High risk HPV: NEGATIVE

## 2020-05-09 ENCOUNTER — Other Ambulatory Visit: Payer: Self-pay | Admitting: Nurse Practitioner

## 2020-05-09 MED ORDER — FLUCONAZOLE 150 MG PO TABS: 150.0000 mg | ORAL_TABLET | Freq: Once | ORAL | 0 refills | Status: AC

## 2020-05-25 ENCOUNTER — Other Ambulatory Visit: Payer: Self-pay | Admitting: Obstetrics and Gynecology

## 2020-05-25 DIAGNOSIS — Z1231 Encounter for screening mammogram for malignant neoplasm of breast: Secondary | ICD-10-CM

## 2020-07-06 ENCOUNTER — Ambulatory Visit: Payer: No Typology Code available for payment source | Admitting: *Deleted

## 2020-07-06 ENCOUNTER — Ambulatory Visit
Admission: RE | Admit: 2020-07-06 | Discharge: 2020-07-06 | Disposition: A | Discharge status: Home / Self Care | Payer: No Typology Code available for payment source | Source: Ambulatory Visit | Attending: Obstetrics and Gynecology | Admitting: Obstetrics and Gynecology

## 2020-07-06 ENCOUNTER — Other Ambulatory Visit: Payer: Self-pay

## 2020-07-06 VITALS — BP 168/98 | Wt 185.8 lb

## 2020-07-06 DIAGNOSIS — Z1239 Encounter for other screening for malignant neoplasm of breast: Secondary | ICD-10-CM

## 2020-07-06 DIAGNOSIS — Z1231 Encounter for screening mammogram for malignant neoplasm of breast: Secondary | ICD-10-CM

## 2020-07-06 NOTE — Progress Notes (Signed)
[name] is a 49 y.o. female who presents to [facility] clinic today with no complaints.    Pap Smear: Pap smear not completed today. Last Pap smear was 05/03/2020 at [facility] and was normal with negative with negative HPV. Per patient has no history of an abnormal Pap smear. Last Pap smear result is available in Epic.   Physical exam: Breasts Breasts symmetrical. No skin abnormalities bilateral breasts. No nipple retraction bilateral breasts. No nipple discharge bilateral breasts. No lymphadenopathy. No lumps palpated bilateral breasts. No complaints of pain or tenderness on exam.       Pelvic/Bimanual Pap is not indicated today per BCCCP guidelines.    Smoking History: Patient has never smoked.  Patient Navigation: Patient education provided. Access to services provided for patient through [place] program. Spanish interpreter Michele Vincent from [facility] provided. Transportation provided to the [facility] for her mammogram and home following appointment.  Colorectal Cancer Screening: Per patient has had colonoscopy completed on 04/20/2014. No complaints today.    Breast and Cervical Cancer Risk Assessment: Patient does not have family history of breast cancer, known genetic mutations, or radiation treatment to the chest before age 49. Patient does not have history of cervical dysplasia, immunocompromised, or DES exposure in-utero.  Risk Assessment    Risk Scores      07/06/2020   Last edited by: Narda Rutherford, LPN   5-year risk: 0.7 %   Lifetime risk: 7.2 %          A: BCCCP exam without pap smear No complaints.  P: Referred patient to the [facility] for a screening mammogram. Appointment scheduled Tuesday, July 06, 2020 at 1130.  Priscille Heidelberg, RN 07/06/2020 10:03 AM

## 2020-07-06 NOTE — Patient Instructions (Signed)
Explained breast self awareness with Oneita Hurt. Patient did not need a Pap smear today due to last Pap smear and HPV typing was 05/03/2020. Let her know BCCCP will cover Pap smears and HPV typing every 5 years unless has a history of abnormal Pap smears. Referred patient to the [facility] for a screening mammogram. Appointment scheduled Tuesday, July 06, 2020 at 1130. Patient aware of appointment and will be there. Let patient know the Breast Center will follow up with her within the next couple weeks with results of her mammogram by letter or phone. Blanchie Verl Bangs Flores verbalized understanding.  Jewelianna Pancoast, Kathaleen Maser, RN 10:04 AM

## 2020-07-07 ENCOUNTER — Other Ambulatory Visit: Payer: Self-pay | Admitting: Obstetrics and Gynecology

## 2020-07-07 DIAGNOSIS — R928 Other abnormal and inconclusive findings on diagnostic imaging of breast: Secondary | ICD-10-CM

## 2020-08-09 MED FILL — traZODone HCL 50 MG TABS: 50 | 30 days supply | Qty: 30 | Fill #0

## 2020-08-09 MED FILL — DULoxetine HCL 60 MG CPEP: 60 | 30 days supply | Qty: 30 | Fill #0

## 2020-10-11 MED FILL — DULoxetine HCL 60 MG CPEP: 60 | 30 days supply | Qty: 30 | Fill #1

## 2020-10-11 MED FILL — traZODone HCL 50 MG TABS: 50 | 30 days supply | Qty: 30 | Fill #1

## 2020-12-01 ENCOUNTER — Other Ambulatory Visit: Payer: Self-pay

## 2020-12-01 MED FILL — Duloxetine HCl Enteric Coated Pellets Cap 60 MG (Base Eq): ORAL | 30 days supply | Qty: 30 | Fill #0 | Status: AC

## 2020-12-01 MED FILL — Trazodone HCl Tab 50 MG: ORAL | 30 days supply | Qty: 30 | Fill #0 | Status: AC

## 2021-01-14 ENCOUNTER — Other Ambulatory Visit: Payer: Self-pay

## 2021-01-14 MED FILL — Duloxetine HCl Enteric Coated Pellets Cap 60 MG (Base Eq): ORAL | 30 days supply | Qty: 30 | Fill #1 | Status: AC

## 2021-01-14 MED FILL — Trazodone HCl Tab 50 MG: ORAL | 30 days supply | Qty: 30 | Fill #1 | Status: AC

## 2021-03-01 ENCOUNTER — Other Ambulatory Visit: Payer: Self-pay

## 2021-03-01 MED FILL — Trazodone HCl Tab 50 MG: ORAL | 30 days supply | Qty: 30 | Fill #2 | Status: AC

## 2021-03-01 MED FILL — Duloxetine HCl Enteric Coated Pellets Cap 60 MG (Base Eq): ORAL | 30 days supply | Qty: 30 | Fill #2 | Status: AC

## 2021-04-04 ENCOUNTER — Other Ambulatory Visit: Payer: Self-pay

## 2021-04-04 MED FILL — Trazodone HCl Tab 50 MG: ORAL | 30 days supply | Qty: 30 | Fill #3 | Status: AC

## 2021-04-04 MED FILL — Duloxetine HCl Enteric Coated Pellets Cap 60 MG (Base Eq): ORAL | 30 days supply | Qty: 30 | Fill #3 | Status: AC

## 2021-05-18 ENCOUNTER — Ambulatory Visit: Payer: No Typology Code available for payment source | Admitting: Physician Assistant

## 2021-06-01 ENCOUNTER — Ambulatory Visit: Payer: No Typology Code available for payment source | Admitting: Physician Assistant

## 2021-06-09 ENCOUNTER — Ambulatory Visit: Payer: Self-pay | Attending: Physician Assistant | Admitting: Physician Assistant

## 2021-06-09 ENCOUNTER — Other Ambulatory Visit: Payer: Self-pay

## 2021-06-09 ENCOUNTER — Encounter: Payer: Self-pay | Admitting: Physician Assistant

## 2021-06-09 VITALS — BP 142/80 | HR 104 | Ht 61.0 in | Wt 192.5 lb

## 2021-06-09 DIAGNOSIS — F32A Depression, unspecified: Secondary | ICD-10-CM

## 2021-06-09 DIAGNOSIS — K582 Mixed irritable bowel syndrome: Secondary | ICD-10-CM

## 2021-06-09 DIAGNOSIS — F419 Anxiety disorder, unspecified: Secondary | ICD-10-CM

## 2021-06-09 DIAGNOSIS — G4709 Other insomnia: Secondary | ICD-10-CM

## 2021-06-09 DIAGNOSIS — Z13220 Encounter for screening for lipoid disorders: Secondary | ICD-10-CM

## 2021-06-09 DIAGNOSIS — R030 Elevated blood-pressure reading, without diagnosis of hypertension: Secondary | ICD-10-CM

## 2021-06-09 DIAGNOSIS — Z131 Encounter for screening for diabetes mellitus: Secondary | ICD-10-CM

## 2021-06-09 DIAGNOSIS — R7989 Other specified abnormal findings of blood chemistry: Secondary | ICD-10-CM

## 2021-06-09 DIAGNOSIS — Z23 Encounter for immunization: Secondary | ICD-10-CM

## 2021-06-09 MED ORDER — DICYCLOMINE HCL 10 MG PO CAPS
10.0000 mg | ORAL_CAPSULE | Freq: Three times a day (TID) | ORAL | 1 refills | Status: DC | PRN
  Filled 2021-06-09: qty 90, 30d supply, fill #0

## 2021-06-09 MED ORDER — TRAZODONE HCL 50 MG PO TABS
ORAL_TABLET | Freq: Every day | ORAL | 1 refills | Status: DC
  Filled 2021-06-09: qty 30, 30d supply, fill #0
  Filled 2021-07-13: qty 30, 30d supply, fill #1
  Filled 2021-08-22: qty 30, 30d supply, fill #0
  Filled 2021-10-12: qty 30, 30d supply, fill #1
  Filled 2022-02-03: qty 30, 30d supply, fill #2
  Filled 2022-03-20: qty 30, 30d supply, fill #3

## 2021-06-09 MED ORDER — DULOXETINE HCL 60 MG PO CPEP
ORAL_CAPSULE | Freq: Every day | ORAL | 1 refills | Status: DC
  Filled 2021-06-09: qty 30, 30d supply, fill #0
  Filled 2021-07-13: qty 30, 30d supply, fill #1
  Filled 2021-08-22: qty 30, 30d supply, fill #0
  Filled 2021-10-12: qty 30, 30d supply, fill #1
  Filled 2022-02-03: qty 30, 30d supply, fill #2
  Filled 2022-03-20: qty 30, 30d supply, fill #3

## 2021-06-09 NOTE — Progress Notes (Signed)
Michele Vincent, is a 50 y.o. female  [id number]  [id number]  DOB - Apr 30, 1971  Chief Complaint  Patient presents with   Medication Refill       Subjective:   Michele Vincent is a 50 y.o. female here today for med RF.  She has been out of her meds for about 1 month.  Her anxiety has increased greatly.  Denies SI/HI.  Felt good when she was on her meds.    Also needs RF on meds for IBS.    ALLERGIES: No Known Allergies  PAST MEDICAL HISTORY: Past Medical History:  Diagnosis Date   Anxiety    Cholelithiases    Colitis    Depression    Diverticulitis    Diverticulosis    Fatty liver    Gastritis    IBS (irritable bowel syndrome)     MEDICATIONS AT HOME: Prior to Admission medications   Medication Sig Start Date End Date Taking? Authorizing Provider  dicyclomine (BENTYL) 10 MG capsule Take 1 capsule (10 mg total) by mouth 3 (three) times daily as needed (crampy abdominal pain). 06/09/21   Michele Simmonds, PA-C  DULoxetine (CYMBALTA) 60 MG capsule TAKE 1 CAPSULE (60 MG TOTAL) BY MOUTH DAILY. 06/09/21 06/09/22  Michele Simmonds, PA-C  Multiple Minerals (CALCIUM/MAGNESIUM/ZINC PO) Take by mouth.    [provider]  traZODone (DESYREL) 50 MG tablet TAKE 1 TABLET (50 MG TOTAL) BY MOUTH AT BEDTIME. 06/09/21 06/09/22  Michele Vincent, Marzella Schlein, PA-C    ROS: Neg HEENT Neg resp Neg cardiac Neg GI Neg GU Neg MS Neg psych Neg neuro  Objective:   Vitals:   06/09/21 1557  BP: (!) 142/80  Pulse: (!) 104  SpO2: 100%  Weight: 192 lb 8 oz (87.3 kg)  Height: 5\' 1"  (1.549 m)   Exam General appearance : Awake, alert, not in any distress. Speech Clear. Not toxic looking HEENT: Atraumatic and Normocephalic Neck: Supple, no JVD. No cervical lymphadenopathy.  Chest: Good air entry bilaterally, CTAB.  No rales/rhonchi/wheezing CVS: S1 S2 regular, no murmurs.  Extremities: B/L Lower Ext shows no edema, both legs are warm to touch Neurology: Awake alert,  and oriented X 3, CN II-XII intact, Non focal Skin: No Rash  Data Review Lab Results  Component Value Date   HGBA1C 5.4 04/19/2020   HGBA1C 5.5 03/30/2014    Assessment & Plan   1. Anxiety and depression Resume meds!!! - DULoxetine (CYMBALTA) 60 MG capsule; TAKE 1 CAPSULE (60 MG TOTAL) BY MOUTH DAILY.  Dispense: 90 capsule; Refill: 1 - traZODone (DESYREL) 50 MG tablet; TAKE 1 TABLET (50 MG TOTAL) BY MOUTH AT BEDTIME.  Dispense: 90 tablet; Refill: 1  2. Other insomnia - traZODone (DESYREL) 50 MG tablet; TAKE 1 TABLET (50 MG TOTAL) BY MOUTH AT BEDTIME.  Dispense: 90 tablet; Refill: 1  3. Screening cholesterol level - Lipid panel  4. Screening for diabetes mellitus I have had a lengthy discussion and provided education about insulin resistance and the intake of too much sugar/refined carbohydrates.  I have advised the patient to work at a goal of eliminating sugary drinks, candy, desserts, sweets, refined sugars, processed foods, and white carbohydrates.  The patient expresses understanding.   - Hemoglobin A1c  5. Elevated TSH - Thyroid Panel With TSH  6. Elevated blood pressure reading Check BP OOO and record. We have discussed target BP range and blood pressure goal. I have advised patient to check BP regularly and to call 04/01/2014 back or  report to clinic if the numbers are consistently higher than 140/90. We discussed the importance of compliance with medical therapy and DASH diet recommended, consequences of uncontrolled hypertension discussed.   - Comprehensive metabolic panel - CBC with Differential  7. Irritable bowel syndrome with both constipation and diarrhea - dicyclomine (BENTYL) 10 MG capsule; Take 1 capsule (10 mg total) by mouth 3 (three) times daily as needed (crampy abdominal pain).  Dispense: 90 capsule; Refill: 1  [employer] interpreter "Eveline" used and additional time performing visit was required.   Patient have been counseled extensively about nutrition and  exercise. Other issues discussed during this visit include: low cholesterol diet, weight control and daily exercise, foot care, annual eye examinations at Ophthalmology, importance of adherence with medications and regular follow-up. We also discussed long term complications of uncontrolled diabetes and hypertension.   Return in about 4 months (around 10/07/2021) for see PCP for chronic conditions.  The patient was given clear instructions to go to ER or return to medical center if symptoms don't improve, worsen or new problems develop. The patient verbalized understanding. The patient was told to call to get lab results if they haven't heard anything in the next week.      Michele Caldron, PA-C [facility] and [place], [place]   06/09/2021, 4:11 PM Patient ID: Michele Vincent, female   DOB: October 27, 1970, 50 y.o.   MRN: [id number]

## 2021-06-14 ENCOUNTER — Other Ambulatory Visit: Payer: Self-pay

## 2021-06-14 ENCOUNTER — Other Ambulatory Visit: Payer: Self-pay | Admitting: Physician Assistant

## 2021-06-14 MED ORDER — ATORVASTATIN CALCIUM 20 MG PO TABS
20.0000 mg | ORAL_TABLET | Freq: Every day | ORAL | 3 refills | Status: DC
  Filled 2021-06-14: qty 30, 30d supply, fill #0

## 2021-06-15 LAB — LIPID PANEL
Chol/HDL Ratio: 2.6 ratio (ref 0.0–4.4)
Cholesterol, Total: 232 mg/dL — ABNORMAL HIGH (ref 100–199)
HDL: 88 mg/dL (ref >39)
LDL Chol Calc (NIH): 114 mg/dL — ABNORMAL HIGH (ref 0–99)
Triglycerides: 180 mg/dL — ABNORMAL HIGH (ref 0–149)
VLDL Cholesterol Cal: 30 mg/dL (ref 5–40)

## 2021-06-15 LAB — CBC WITH DIFFERENTIAL/PLATELET
Basophils Absolute: 0.1 10*3/uL (ref 0.0–0.2)
Basos: 1 %
EOS (ABSOLUTE): 0.1 10*3/uL (ref 0.0–0.4)
Eos: 1 %
Hematocrit: 40.6 % (ref 34.0–46.6)
Hemoglobin: 13.5 g/dL (ref 11.1–15.9)
Immature Grans (Abs): 0 10*3/uL (ref 0.0–0.1)
Immature Granulocytes: 0 %
Lymphocytes Absolute: 2.9 10*3/uL (ref 0.7–3.1)
Lymphs: 32 %
MCH: 30.4 pg (ref 26.6–33.0)
MCHC: 33.3 g/dL (ref 31.5–35.7)
MCV: 91 fL (ref 79–97)
Monocytes Absolute: 0.7 10*3/uL (ref 0.1–0.9)
Monocytes: 7 %
Neutrophils Absolute: 5.4 10*3/uL (ref 1.4–7.0)
Neutrophils: 59 %
Platelets: 266 10*3/uL (ref 150–450)
RBC: 4.44 x10E6/uL (ref 3.77–5.28)
RDW: 13 % (ref 11.7–15.4)
WBC: 9.2 10*3/uL (ref 3.4–10.8)

## 2021-06-15 LAB — COMPREHENSIVE METABOLIC PANEL
ALT: 18 IU/L (ref 0–32)
AST: 20 IU/L (ref 0–40)
Albumin/Globulin Ratio: 2 (ref 1.2–2.2)
Albumin: 5.1 g/dL — ABNORMAL HIGH (ref 3.8–4.8)
Alkaline Phosphatase: 91 IU/L (ref 44–121)
BUN/Creatinine Ratio: 11 (ref 9–23)
BUN: 10 mg/dL (ref 6–24)
Bilirubin Total: 0.5 mg/dL (ref 0.0–1.2)
CO2: 20 mmol/L (ref 20–29)
Calcium: 10.3 mg/dL — ABNORMAL HIGH (ref 8.7–10.2)
Chloride: 99 mmol/L (ref 96–106)
Creatinine, Ser: 0.91 mg/dL (ref 0.57–1.00)
Globulin, Total: 2.6 g/dL (ref 1.5–4.5)
Glucose: 125 mg/dL — ABNORMAL HIGH (ref 70–99)
Potassium: 4.5 mmol/L (ref 3.5–5.2)
Sodium: 141 mmol/L (ref 134–144)
Total Protein: 7.7 g/dL (ref 6.0–8.5)
eGFR: 77 mL/min/{1.73_m2} (ref >59)

## 2021-06-15 LAB — THYROID PANEL WITH TSH
Free Thyroxine Index: 1.5 (ref 1.2–4.9)
T3 Uptake Ratio: 21 % — ABNORMAL LOW (ref 24–39)
T4, Total: 7.2 ug/dL (ref 4.5–12.0)
TSH: 3.79 u[IU]/mL (ref 0.450–4.500)

## 2021-06-15 LAB — HEMOGLOBIN A1C
Est. average glucose Bld gHb Est-mCnc: 114 mg/dL
Hgb A1c MFr Bld: 5.6 % (ref 4.8–5.6)

## 2021-06-21 ENCOUNTER — Other Ambulatory Visit: Payer: Self-pay

## 2021-07-13 ENCOUNTER — Other Ambulatory Visit: Payer: Self-pay

## 2021-08-22 ENCOUNTER — Other Ambulatory Visit: Payer: Self-pay

## 2021-10-11 ENCOUNTER — Other Ambulatory Visit: Payer: Self-pay

## 2021-10-12 ENCOUNTER — Other Ambulatory Visit: Payer: Self-pay

## 2021-10-14 ENCOUNTER — Other Ambulatory Visit: Payer: Self-pay

## 2022-02-03 ENCOUNTER — Other Ambulatory Visit: Payer: Self-pay

## 2022-03-20 ENCOUNTER — Other Ambulatory Visit: Payer: Self-pay

## 2022-05-01 ENCOUNTER — Other Ambulatory Visit: Payer: Self-pay | Admitting: Physician Assistant

## 2022-05-01 ENCOUNTER — Other Ambulatory Visit: Payer: Self-pay | Admitting: Pharmacist

## 2022-05-01 ENCOUNTER — Other Ambulatory Visit: Payer: Self-pay

## 2022-05-01 DIAGNOSIS — F419 Anxiety disorder, unspecified: Secondary | ICD-10-CM

## 2022-05-01 DIAGNOSIS — G4709 Other insomnia: Secondary | ICD-10-CM

## 2022-05-01 MED ORDER — DULOXETINE HCL 60 MG PO CPEP
60.0000 mg | ORAL_CAPSULE | Freq: Every day | ORAL | 0 refills | Status: DC
  Filled 2022-05-01: qty 30, 30d supply, fill #0

## 2022-05-01 MED ORDER — TRAZODONE HCL 50 MG PO TABS
50.0000 mg | ORAL_TABLET | Freq: Every day | ORAL | 0 refills | Status: DC
  Filled 2022-05-01: qty 30, 30d supply, fill #0

## 2022-06-01 ENCOUNTER — Ambulatory Visit: Payer: Self-pay | Admitting: Physician Assistant

## 2022-11-21 ENCOUNTER — Ambulatory Visit: Payer: Self-pay | Attending: Nurse Practitioner | Admitting: Nurse Practitioner

## 2022-11-21 ENCOUNTER — Other Ambulatory Visit: Payer: Self-pay

## 2022-11-21 ENCOUNTER — Encounter: Payer: Self-pay | Admitting: Nurse Practitioner

## 2022-11-21 VITALS — BP 117/76 | HR 94 | Ht 61.0 in | Wt 170.0 lb

## 2022-11-21 DIAGNOSIS — E785 Hyperlipidemia, unspecified: Secondary | ICD-10-CM

## 2022-11-21 DIAGNOSIS — D72829 Elevated white blood cell count, unspecified: Secondary | ICD-10-CM

## 2022-11-21 DIAGNOSIS — F419 Anxiety disorder, unspecified: Secondary | ICD-10-CM

## 2022-11-21 DIAGNOSIS — E559 Vitamin D deficiency, unspecified: Secondary | ICD-10-CM

## 2022-11-21 DIAGNOSIS — F32A Depression, unspecified: Secondary | ICD-10-CM

## 2022-11-21 DIAGNOSIS — K582 Mixed irritable bowel syndrome: Secondary | ICD-10-CM

## 2022-11-21 DIAGNOSIS — G4709 Other insomnia: Secondary | ICD-10-CM

## 2022-11-21 DIAGNOSIS — Z1231 Encounter for screening mammogram for malignant neoplasm of breast: Secondary | ICD-10-CM

## 2022-11-21 MED ORDER — ATORVASTATIN CALCIUM 20 MG PO TABS
20.0000 mg | ORAL_TABLET | Freq: Every day | ORAL | 3 refills | Status: DC
  Filled 2022-11-21: qty 90, 90d supply, fill #0

## 2022-11-21 MED ORDER — TRAZODONE HCL 50 MG PO TABS
50.0000 mg | ORAL_TABLET | Freq: Every day | ORAL | 0 refills | Status: DC
  Filled 2022-11-21: qty 30, 30d supply, fill #0

## 2022-11-21 MED ORDER — DICYCLOMINE HCL 10 MG PO CAPS
10.0000 mg | ORAL_CAPSULE | Freq: Three times a day (TID) | ORAL | 1 refills | Status: DC | PRN
  Filled 2022-11-21: qty 90, 30d supply, fill #0

## 2022-11-21 MED ORDER — DULOXETINE HCL 60 MG PO CPEP
60.0000 mg | ORAL_CAPSULE | Freq: Every day | ORAL | 1 refills | Status: DC
  Filled 2022-11-21: qty 30, 30d supply, fill #0
  Filled 2023-01-03: qty 30, 30d supply, fill #1
  Filled 2023-01-10: qty 90, 90d supply, fill #1
  Filled 2023-04-18: qty 60, 60d supply, fill #2

## 2022-11-21 NOTE — Progress Notes (Signed)
6 months without Cymbalta  Bentyl not working for patient

## 2022-11-21 NOTE — Progress Notes (Signed)
Assessment & Plan:  Michele Vincent was seen today for anxiety and depression.  Diagnoses and all orders for this visit:  Anxiety and depression -     DULoxetine (CYMBALTA) 60 MG capsule; Take 1 capsule (60 mg total) by mouth daily. -     traZODone (DESYREL) 50 MG tablet; TAKE 1 TABLET (50 MG TOTAL) BY MOUTH AT BEDTIME. -     Thyroid Panel With TSH -     Hemoglobin A1c -     Ambulatory referral to Behavioral Health  Irritable bowel syndrome with both constipation and diarrhea Needs GI referral Patient was urged to apply for the financial assistance program.  They were instructed to inquire at the front desk about the application process for the [employer] discount, orange card or other financial assistance.     -     dicyclomine (BENTYL) 10 MG capsule; Take 1 capsule (10 mg total) by mouth 3 (three) times daily as needed (crampy abdominal pain). -     CMP14+EGFR  Other insomnia -     traZODone (DESYREL) 50 MG tablet; TAKE 1 TABLET (50 MG TOTAL) BY MOUTH AT BEDTIME.  Breast cancer screening by mammogram -     MM DIGITAL SCREENING BILATERAL; Future  Hyperlipidemia, unspecified hyperlipidemia type -     atorvastatin (LIPITOR) 20 MG tablet; Take 1 tablet (20 mg total) by mouth daily. -     Lipid panel  Vitamin D deficiency disease -     VITAMIN D 25 Hydroxy (Vit-D Deficiency, Fractures)  Leukocytosis, unspecified type -     CBC with Differential    Patient has been counseled on age-appropriate routine health concerns for screening and prevention. These are reviewed and up-to-date. Referrals have been placed accordingly. Immunizations are up-to-date or declined.    Subjective:   Chief Complaint  Patient presents with   Anxiety   Depression   Anxiety Symptoms include nervous/anxious behavior. Patient reports no chest pain, dizziness, nausea, palpitations, shortness of breath or suicidal ideas.    Depression        Associated symptoms include no myalgias, no headaches and no  suicidal ideas.  Past medical history includes anxiety.    Michele Vincent 52 y.o. female presents to office for follow up to anxiety and depression.   She has an onsite interpreter accompanying her today. I have not seen her in the office since 2021  She has a past medical history of Anxiety, Cholelithiases, Colitis, Depression, Diverticulitis, Diverticulosis, Fatty liver, Gastritis, and IBS with constipation and diarrhea alternating   IBS She is currently experiencing symptoms of: generalized swelling, bloating, colic. She does notice increased discomfort after eating and states she has lost a significant amount of weight due to this.  Alternates between diarrhea and constipation. Does not feel dicyclomine is helpful however I do not see any recent prescriptions from the past year. She is likely taking outdated medication.    Anxiety and Depression  Has been out of cymbalta and trazodone. Would like refills.     11/21/2022    2:32 PM 06/09/2021    4:22 PM 05/03/2020   11:10 AM  Depression screen PHQ 2/9  Decreased Interest 1 1 0  Down, Depressed, Hopeless PHQ - 2 Score Altered sleeping Tired, decreased energy 1 1 0  Change in appetite 2  0  Feeling bad or failure about yourself  1  0  Trouble concentrating 1 1 1  Moving slowly or fidgety/restless 3 0 0  Suicidal thoughts 0 0 0  PHQ-9 Score 14 7 3   Difficult doing work/chores  Somewhat difficult       11/21/2022    2:32 PM 06/09/2021    4:22 PM 05/03/2020   11:11 AM 04/07/2020   10:59 AM  GAD 7 : Generalized Anxiety Score  Nervous, Anxious, on Edge 0 2 1 0  Control/stop worrying 0 2 1 0  Worry too much - different things 0 2 1 0  Trouble relaxing 0  1 0  Restless 0 1 1 0  Easily annoyed or irritable 0 1 0 0  Afraid - awful might happen 0 2 1 0  Total GAD 7 Score 0  6 0  Anxiety Difficulty  Somewhat difficult          Review of Systems  Constitutional:  Negative for fever,  malaise/fatigue and weight loss.  HENT: Negative.  Negative for nosebleeds.   Eyes: Negative.  Negative for blurred vision, double vision and photophobia.  Respiratory: Negative.  Negative for cough and shortness of breath.   Cardiovascular: Negative.  Negative for chest pain, palpitations and leg swelling.  Gastrointestinal:  Positive for abdominal pain, constipation and diarrhea. Negative for blood in stool, heartburn, melena, nausea and vomiting.  Genitourinary: Negative.   Musculoskeletal: Negative.  Negative for myalgias.  Neurological: Negative.  Negative for dizziness, focal weakness, seizures and headaches.  Psychiatric/Behavioral:  Positive for depression. Negative for suicidal ideas. The patient is nervous/anxious.     Past Medical History:  Diagnosis Date   Anxiety    Cholelithiases    Colitis    Depression    Diverticulitis    Diverticulosis    Fatty liver    Gastritis    IBS (irritable bowel syndrome)     Past Surgical History:  Procedure Laterality Date   COLONOSCOPY     NO PAST SURGERIES      Family History  Problem Relation Age of Onset   CVA Mother    Hypertension Mother    Pneumonia Father    Hypertension Sister    Hypertension Sister    Colon cancer Neg Hx     Social History Reviewed with no changes to be made today.   Outpatient Medications Prior to Visit  Medication Sig Dispense Refill   Krill Oil (OMEGA-3) 500 MG CAPS Take by mouth.     Magnesium 125 MG CAPS Take by mouth 2 (two) times a week.     atorvastatin (LIPITOR) 20 MG tablet Take 1 tablet (20 mg total) by mouth daily. (Patient not taking: Reported on 11/21/2022) 90 tablet 3   dicyclomine (BENTYL) 10 MG capsule Take 1 capsule (10 mg total) by mouth 3 (three) times daily as needed (crampy abdominal pain). (Patient not taking: Reported on 11/21/2022) 90 capsule 1   DULoxetine (CYMBALTA) 60 MG capsule TAKE 1 CAPSULE (60 MG TOTAL) BY MOUTH DAILY. (Patient not taking: Reported on 11/21/2022) 30  capsule 0   Multiple Minerals (CALCIUM/MAGNESIUM/ZINC PO) Take by mouth. (Patient not taking: Reported on 11/21/2022)     traZODone (DESYREL) 50 MG tablet TAKE 1 TABLET (50 MG TOTAL) BY MOUTH AT BEDTIME. (Patient not taking: Reported on 11/21/2022) 30 tablet 0   No facility-administered medications prior to visit.    No Known Allergies     Objective:    BP 117/76   Pulse 94   Ht 5\' 1"  (1.549 m)   Wt 170 lb (77.1 kg)   LMP  11/20/2022 (Exact Date)   SpO2 99%   BMI 32.12 kg/m  Wt Readings from Last 3 Encounters:  11/21/22 170 lb (77.1 kg)  06/09/21 192 lb 8 oz (87.3 kg)  07/06/20 185 lb 12.8 oz (84.3 kg)    Physical Exam Vitals and nursing note reviewed.  Constitutional:      Appearance: She is well-developed.  HENT:     Head: Normocephalic and atraumatic.  Cardiovascular:     Rate and Rhythm: Normal rate and regular rhythm.     Heart sounds: Normal heart sounds. No murmur heard.    No friction rub. No gallop.  Pulmonary:     Effort: Pulmonary effort is normal. No tachypnea or respiratory distress.     Breath sounds: Normal breath sounds. No decreased breath sounds, wheezing, rhonchi or rales.  Chest:     Chest wall: No tenderness.  Abdominal:     General: Bowel sounds are normal.     Palpations: Abdomen is soft.  Musculoskeletal:        General: Normal range of motion.     Cervical back: Normal range of motion.  Skin:    General: Skin is warm and dry.  Neurological:     Mental Status: She is alert and oriented to person, place, and time.     Coordination: Coordination normal.  Psychiatric:        Behavior: Behavior normal. Behavior is cooperative.        Thought Content: Thought content normal.        Judgment: Judgment normal.          Patient has been counseled extensively about nutrition and exercise as well as the importance of adherence with medications and regular follow-up. The patient was given clear instructions to go to ER or return to medical  center if symptoms don't improve, worsen or new problems develop. The patient verbalized understanding.   Follow-up: Return in about 3 months (around 02/20/2023).   Claiborne Rigg, FNP-BC [facility] [place], [place] [phone]   11/21/2022, 3:02 PM

## 2022-11-22 LAB — CBC WITH DIFFERENTIAL/PLATELET
Basophils Absolute: 0.1 10*3/uL (ref 0.0–0.2)
Basos: 1 %
EOS (ABSOLUTE): 0.1 10*3/uL (ref 0.0–0.4)
Eos: 1 %
Hematocrit: 43 % (ref 34.0–46.6)
Hemoglobin: 13.9 g/dL (ref 11.1–15.9)
Immature Grans (Abs): 0 10*3/uL (ref 0.0–0.1)
Immature Granulocytes: 0 %
Lymphocytes Absolute: 2.6 10*3/uL (ref 0.7–3.1)
Lymphs: 34 %
MCH: 30.2 pg (ref 26.6–33.0)
MCHC: 32.3 g/dL (ref 31.5–35.7)
MCV: 93 fL (ref 79–97)
Monocytes Absolute: 0.7 10*3/uL (ref 0.1–0.9)
Monocytes: 9 %
Neutrophils Absolute: 4.3 10*3/uL (ref 1.4–7.0)
Neutrophils: 55 %
Platelets: 253 10*3/uL (ref 150–450)
RBC: 4.61 x10E6/uL (ref 3.77–5.28)
RDW: 13.4 % (ref 11.7–15.4)
WBC: 7.8 10*3/uL (ref 3.4–10.8)

## 2022-11-22 LAB — CMP14+EGFR
ALT: 17 IU/L (ref 0–32)
AST: 18 IU/L (ref 0–40)
Albumin/Globulin Ratio: 1.8 (ref 1.2–2.2)
Albumin: 4.7 g/dL (ref 3.9–4.9)
Alkaline Phosphatase: 79 IU/L (ref 44–121)
BUN/Creatinine Ratio: 8 — ABNORMAL LOW (ref 9–23)
BUN: 6 mg/dL (ref 6–24)
Bilirubin Total: 0.2 mg/dL (ref 0.0–1.2)
CO2: 23 mmol/L (ref 20–29)
Calcium: 9.7 mg/dL (ref 8.7–10.2)
Chloride: 104 mmol/L (ref 96–106)
Creatinine, Ser: 0.72 mg/dL (ref 0.57–1.00)
Globulin, Total: 2.6 g/dL (ref 1.5–4.5)
Glucose: 124 mg/dL — ABNORMAL HIGH (ref 70–99)
Potassium: 3.6 mmol/L (ref 3.5–5.2)
Sodium: 140 mmol/L (ref 134–144)
Total Protein: 7.3 g/dL (ref 6.0–8.5)
eGFR: 102 mL/min/{1.73_m2} (ref >59)

## 2022-11-22 LAB — THYROID PANEL WITH TSH
Free Thyroxine Index: 1.6 (ref 1.2–4.9)
T3 Uptake Ratio: 23 % — ABNORMAL LOW (ref 24–39)
T4, Total: 7 ug/dL (ref 4.5–12.0)
TSH: 3.84 u[IU]/mL (ref 0.450–4.500)

## 2022-11-22 LAB — LIPID PANEL
Chol/HDL Ratio: 3 ratio (ref 0.0–4.4)
Cholesterol, Total: 205 mg/dL — ABNORMAL HIGH (ref 100–199)
HDL: 69 mg/dL (ref >39)
LDL Chol Calc (NIH): 101 mg/dL — ABNORMAL HIGH (ref 0–99)
Triglycerides: 209 mg/dL — ABNORMAL HIGH (ref 0–149)
VLDL Cholesterol Cal: 35 mg/dL (ref 5–40)

## 2022-11-22 LAB — HEMOGLOBIN A1C
Est. average glucose Bld gHb Est-mCnc: 111 mg/dL
Hgb A1c MFr Bld: 5.5 % (ref 4.8–5.6)

## 2022-11-22 LAB — VITAMIN D 25 HYDROXY (VIT D DEFICIENCY, FRACTURES): Vit D, 25-Hydroxy: 10.5 ng/mL — ABNORMAL LOW (ref 30.0–100.0)

## 2022-11-25 ENCOUNTER — Other Ambulatory Visit: Payer: Self-pay | Admitting: Nurse Practitioner

## 2022-11-25 MED ORDER — VITAMIN D (ERGOCALCIFEROL) 1.25 MG (50000 UNIT) PO CAPS
50000.0000 [IU] | ORAL_CAPSULE | ORAL | 0 refills | Status: DC
  Filled 2022-11-25 – 2023-01-10 (×2): qty 12, 84d supply, fill #0

## 2022-11-27 ENCOUNTER — Other Ambulatory Visit: Payer: Self-pay

## 2022-12-04 ENCOUNTER — Other Ambulatory Visit: Payer: Self-pay

## 2022-12-18 ENCOUNTER — Telehealth: Payer: Self-pay

## 2022-12-18 NOTE — Telephone Encounter (Signed)
Telephoned patient at mobile number using interpreter#[id number]. Left voice message with [facility] contact information.

## 2023-01-03 ENCOUNTER — Other Ambulatory Visit: Payer: Self-pay

## 2023-01-03 ENCOUNTER — Other Ambulatory Visit: Payer: Self-pay | Admitting: Nurse Practitioner

## 2023-01-03 DIAGNOSIS — G4709 Other insomnia: Secondary | ICD-10-CM

## 2023-01-03 DIAGNOSIS — F419 Anxiety disorder, unspecified: Secondary | ICD-10-CM

## 2023-01-03 DIAGNOSIS — F32A Depression, unspecified: Secondary | ICD-10-CM

## 2023-01-03 MED ORDER — TRAZODONE HCL 50 MG PO TABS
50.0000 mg | ORAL_TABLET | Freq: Every day | ORAL | 0 refills | Status: DC
  Filled 2023-01-03 – 2023-01-10 (×2): qty 90, 90d supply, fill #0

## 2023-01-03 NOTE — Telephone Encounter (Signed)
Medication Refill - Medication: traZODone (DESYREL) 50 MG tablet  DULoxetine (CYMBALTA) 60 MG capsule  Has the patient contacted their pharmacy? Yes.   (Agent: If no, request that the patient contact the pharmacy for the refill. If patient does not wish to contact the pharmacy document the reason why and proceed with request.) (Agent: If yes, when and what did the pharmacy advise?)  Preferred Pharmacy (with phone number or street name):  [facility]  [address], [address] [place] [address]  Phone: [phone] Fax: [phone]   Has the patient been seen for an appointment in the last year OR does the patient have an upcoming appointment? Yes.    Agent: Please be advised that RX refills may take up to 3 business days. We ask that you follow-up with your pharmacy.

## 2023-01-04 NOTE — Telephone Encounter (Signed)
Unable to refill per protocol, Rx request is too soon. Trazodone lasted refilled 01/03/23 for 90 days and Cymbalta lasted refill 11/21/22 for 90 and 1 refill.  Requested Prescriptions  Pending Prescriptions Disp Refills   traZODone (DESYREL) 50 MG tablet 90 tablet 0    Sig: TAKE 1 TABLET (50 MG TOTAL) BY MOUTH AT BEDTIME.     Psychiatry: Antidepressants - Serotonin Modulator Passed - 01/03/2023  4:13 PM      Passed - Completed PHQ-2 or PHQ-9 in the last 360 days      Passed - Valid encounter within last 6 months    Recent Outpatient Visits           1 month ago Anxiety and depression   [place] [facility] [place], Shea Stakes, NP   1 year ago Screening cholesterol level   [facility] [place], [place], [place]   2 years ago Encounter for Papanicolaou smear for cervical cancer screening   [place] [facility] [place], Shea Stakes, NP   2 years ago Anxiety and depression   [place] [facility] [place], Shea Stakes, NP   3 years ago Other insomnia   [facility] Hoy Register, MD       Future Appointments             In 1 month Claiborne Rigg, NP [facility]             DULoxetine (CYMBALTA) 60 MG capsule 90 capsule 1    Sig: Take 1 capsule (60 mg total) by mouth daily.     Psychiatry: Antidepressants - SNRI - duloxetine Passed - 01/03/2023  4:13 PM      Passed - Cr in normal range and within 360 days    Creat  Date Value Ref Range Status  08/29/2016 0.81 0.50 - 1.10 mg/dL Final   Creatinine, Ser  Date Value Ref Range Status  11/21/2022 0.72 0.57 - 1.00 mg/dL Final         Passed - eGFR is 30 or above and within 360 days    GFR, Est African American  Date Value Ref Range Status  08/29/2016 >89 >=60 mL/min Final   GFR calc Af Amer  Date Value Ref Range Status  04/19/2020 118 >59 mL/min/1.73 Final     Comment:    **[facility] currently reports eGFR in compliance with the current**   recommendations of the [employer]. [facility] will   update reporting as new guidelines are published from the NKF-ASN   Task force.    GFR, Est Non African American  Date Value Ref Range Status  08/29/2016 89 >=60 mL/min Final   GFR calc non Af Amer  Date Value Ref Range Status  04/19/2020 103 >59 mL/min/1.73 Final   GFR  Date Value Ref Range Status  03/24/2014 102.61 >60.00 mL/min Final   eGFR  Date Value Ref Range Status  11/21/2022 102 >59 mL/min/1.73 Final         Passed - Completed PHQ-2 or PHQ-9 in the last 360 days      Passed - Last BP in normal range    BP Readings from Last 1 Encounters:  11/21/22 117/76         Passed - Valid encounter within last 6 months    Recent Outpatient Visits  1 month ago Anxiety and depression   [place] [facility] [place], [place], [place]   1 year ago Screening cholesterol level   [facility] [place], [place], [place]   2 years ago Encounter for Papanicolaou smear for cervical cancer screening   [facility] [place], Shea Stakes, NP   2 years ago Anxiety and depression   [facility] [place], Shea Stakes, NP   3 years ago Other insomnia   [place] [facility] Hoy Register, MD       Future Appointments             In 1 month Claiborne Rigg, NP [facility]

## 2023-01-09 ENCOUNTER — Other Ambulatory Visit: Payer: Self-pay

## 2023-01-10 ENCOUNTER — Other Ambulatory Visit: Payer: Self-pay

## 2023-02-20 ENCOUNTER — Ambulatory Visit: Payer: Self-pay | Admitting: Nurse Practitioner

## 2023-04-18 ENCOUNTER — Other Ambulatory Visit: Payer: Self-pay

## 2023-04-18 ENCOUNTER — Other Ambulatory Visit: Payer: Self-pay | Admitting: Nurse Practitioner

## 2023-04-18 DIAGNOSIS — F32A Depression, unspecified: Secondary | ICD-10-CM

## 2023-04-18 DIAGNOSIS — G4709 Other insomnia: Secondary | ICD-10-CM

## 2023-04-18 MED ORDER — TRAZODONE HCL 50 MG PO TABS
50.0000 mg | ORAL_TABLET | Freq: Every day | ORAL | 0 refills | Status: DC
  Filled 2023-04-18: qty 30, 30d supply, fill #0

## 2023-08-16 ENCOUNTER — Other Ambulatory Visit: Payer: Self-pay | Admitting: Nurse Practitioner

## 2023-08-16 ENCOUNTER — Other Ambulatory Visit (HOSPITAL_COMMUNITY): Payer: Self-pay

## 2023-08-16 DIAGNOSIS — G4709 Other insomnia: Secondary | ICD-10-CM

## 2023-08-16 DIAGNOSIS — F419 Anxiety disorder, unspecified: Secondary | ICD-10-CM

## 2023-08-18 MED ORDER — TRAZODONE HCL 50 MG PO TABS
50.0000 mg | ORAL_TABLET | Freq: Every day | ORAL | 0 refills | Status: DC
  Filled 2023-08-18: qty 30, 30d supply, fill #0

## 2023-08-18 MED ORDER — DULOXETINE HCL 60 MG PO CPEP
60.0000 mg | ORAL_CAPSULE | Freq: Every day | ORAL | 0 refills | Status: DC
  Filled 2023-08-18: qty 30, 30d supply, fill #0

## 2023-08-20 ENCOUNTER — Other Ambulatory Visit (HOSPITAL_COMMUNITY): Payer: Self-pay

## 2023-08-20 ENCOUNTER — Other Ambulatory Visit: Payer: Self-pay

## 2023-09-06 ENCOUNTER — Telehealth: Payer: Self-pay | Admitting: Physician Assistant

## 2023-09-06 ENCOUNTER — Ambulatory Visit: Payer: Self-pay | Attending: Physician Assistant | Admitting: Physician Assistant

## 2023-09-06 ENCOUNTER — Encounter: Payer: Self-pay | Admitting: Physician Assistant

## 2023-09-06 ENCOUNTER — Other Ambulatory Visit: Payer: Self-pay

## 2023-09-06 VITALS — BP 118/79 | HR 100 | Wt 179.4 lb

## 2023-09-06 DIAGNOSIS — Z758 Other problems related to medical facilities and other health care: Secondary | ICD-10-CM

## 2023-09-06 DIAGNOSIS — Z603 Acculturation difficulty: Secondary | ICD-10-CM

## 2023-09-06 DIAGNOSIS — E559 Vitamin D deficiency, unspecified: Secondary | ICD-10-CM

## 2023-09-06 DIAGNOSIS — G4709 Other insomnia: Secondary | ICD-10-CM

## 2023-09-06 DIAGNOSIS — F419 Anxiety disorder, unspecified: Secondary | ICD-10-CM

## 2023-09-06 DIAGNOSIS — J069 Acute upper respiratory infection, unspecified: Secondary | ICD-10-CM

## 2023-09-06 DIAGNOSIS — Z91199 Patient's noncompliance with other medical treatment and regimen due to unspecified reason: Secondary | ICD-10-CM

## 2023-09-06 DIAGNOSIS — E785 Hyperlipidemia, unspecified: Secondary | ICD-10-CM

## 2023-09-06 DIAGNOSIS — F458 Other somatoform disorders: Secondary | ICD-10-CM

## 2023-09-06 DIAGNOSIS — Z1331 Encounter for screening for depression: Secondary | ICD-10-CM

## 2023-09-06 DIAGNOSIS — R0989 Other specified symptoms and signs involving the circulatory and respiratory systems: Secondary | ICD-10-CM

## 2023-09-06 DIAGNOSIS — F32A Depression, unspecified: Secondary | ICD-10-CM

## 2023-09-06 LAB — POC COVID19/FLU A&B COMBO
Covid Antigen, POC: NEGATIVE
Influenza A Antigen, POC: NEGATIVE
Influenza B Antigen, POC: NEGATIVE

## 2023-09-06 MED ORDER — ATORVASTATIN CALCIUM 20 MG PO TABS
20.0000 mg | ORAL_TABLET | Freq: Every day | ORAL | 3 refills | Status: DC
  Filled 2023-09-06: qty 90, 90d supply, fill #0
  Filled 2024-01-28: qty 90, 90d supply, fill #1

## 2023-09-06 MED ORDER — BENZONATATE 100 MG PO CAPS
200.0000 mg | ORAL_CAPSULE | Freq: Three times a day (TID) | ORAL | 0 refills | Status: DC | PRN
  Filled 2023-09-06: qty 40, 7d supply, fill #0

## 2023-09-06 MED ORDER — MELOXICAM 15 MG PO TABS
15.0000 mg | ORAL_TABLET | Freq: Every day | ORAL | 0 refills | Status: DC
  Filled 2023-09-06: qty 30, 30d supply, fill #0

## 2023-09-06 MED ORDER — CHLORPHENIRAMINE MALEATE 4 MG PO TABS
ORAL_TABLET | ORAL | 0 refills | Status: DC
  Filled 2023-09-06: qty 40, fill #0

## 2023-09-06 MED ORDER — DULOXETINE HCL 60 MG PO CPEP
60.0000 mg | ORAL_CAPSULE | Freq: Every day | ORAL | 5 refills | Status: DC
  Filled 2023-09-06 – 2023-11-16 (×2): qty 30, 30d supply, fill #0
  Filled 2024-01-28: qty 30, 30d supply, fill #1

## 2023-09-06 MED ORDER — TRAZODONE HCL 50 MG PO TABS
50.0000 mg | ORAL_TABLET | Freq: Every day | ORAL | 2 refills | Status: DC
  Filled 2023-09-06 – 2023-11-16 (×2): qty 60, 30d supply, fill #0

## 2023-09-06 NOTE — Telephone Encounter (Signed)
Open to counseling.  She is on cymbalta and does feel it helps.  Menopause seems to have made anxiety worse.  She needs to follow up more frequently as directed so we can adjust her plan of care as needed.  PHQ 9 #9=1.  Denies SI/HI.  Has passive thoughts of "wishing I wasn't here."

## 2023-09-06 NOTE — Patient Instructions (Addendum)
Infeccin de las vas respiratorias superiores en adultos Upper Respiratory Infection, Adult Una infeccin de las vas respiratorias superiores (IVRS) afecta la nariz, la garganta y las vas respiratorias superiores que llegan a los pulmones. El tipo ms comn de IVRS suele conocerse como el resfro comn. Las IVRS generalmente mejoran solas, sin tratamiento mdico. Cules son las causas? La causa de las IVRS es un germen (virus). Puede contraer estos grmenes de las siguientes maneras: Al aspirar las gotitas que una persona infectada elimina al toser o Engineering geologist. Al tocar algo que tiene el germen (est contaminado) y luego tocarse la boca, la nariz o los ojos. Qu incrementa el riesgo? Es ms propenso a Health and safety inspector IVRS si: Es muy pequeo o de edad muy [place]. Tiene contacto cercano con otros, como en el [place], la escuela o un centro de atencin mdica. Fuma. Tiene una enfermedad cardaca o pulmonar a largo plazo (crnica). Tiene debilitado el sistema encargado de combatir las enfermedades (sistema inmunitario). Tiene asma o alergias nasales. Tiene mucho estrs. Tiene un dficit nutricional. Cules son los signos o sntomas? Secrecin nasal o nariz tapada (congestin). Tos. Estornudos. Dolor de Advertising copywriter. Dolor de [place]. Sensacin de cansancio (fatiga). Grant Ruts. No querer comer tanto como lo hace habitualmente. Dolor en la frente, detrs de los ojos y por encima de los pmulos (dolor sinusal). Dolores musculares. Enrojecimiento o irritacin de los ojos. Presin en los odos o la cara. Cmo se trata? Las IVRS generalmente mejoran por s solas en un perodo de entre 7 y [address]. Los medicamentos no curan las IVRS, Biomedical engineer el mdico puede recomendarle ciertos medicamentos para ayudar a [employer], como por ejemplo: Medicamentos para la tos de [place]. Medicamentos para reducir la tos (antitusivos). La tos es un tipo de defensa contra las infecciones que ayuda a  Museum/gallery conservator la nariz, la garganta, la trquea y los pulmones (el sistema respiratorio). Tome estos medicamentos solamente como se lo haya indicado el mdico. Medicamentos para bajar la [place]. Siga estas instrucciones en su casa: Actividad Descanse todo lo que sea necesario. Si tiene fiebre, [employer], sin ir al Aleen Campi o a la escuela, hasta que ya no tenga fiebre, o hasta que el mdico le indique que puede regresar al Aleen Campi o a la escuela. Debe permanecer en su casa hasta que ya no pueda propagar (contagiar) la infeccin. Es posible que el mdico le indique que use una mascarilla para tener menos riesgo de propagar la infeccin. Para aliviar los sntomas Enjuguese la boca frecuentemente con una mezcla de agua con sal. Para preparar agua con sal, disuelva de  a 1 cucharadita (de 3 a 6 g) de sal en 1 taza (237 ml) de agua tibia. Use un humidificador de aire fro para agregar humedad al aire. Esto puede ayudarlo a que respire mejor. Comida y bebida  Beba suficiente lquido para mantener la orina de color amarillo plido. Tome sopas y caldos transparentes. Instrucciones generales  Use los medicamentos de venta libre y los recetados solamente como se lo haya indicado el mdico. No fume ni consuma ningn producto que contenga nicotina o tabaco. Si necesita ayuda para dejar de fumar, consulte al mdico. Evite estar cerca de personas que fuman (evite el humo ambiental de tabaco). Mantngase al da con todas las vacunas (inmunizaciones) y aplquese la vacuna contra la gripe todos los [place]. Concurra a todas las visitas de seguimiento. Cmo evitar contagiar la infeccin a otros  Lvese las manos con agua y jabn durante al menos 20  segundos. Use un desinfectante para manos si no dispone de [place] y [place]. Evite tocarse la boca, la cara, los ojos o la [place]. Tosa o estornude en un pauelo de papel o sobre su manga o codo. No tosa o estornude al aire ni se cubra la boca o la nariz con la  [place]. Comunquese con un mdico si: Siente que empeora o que no mejora. Tiene alguno de estos sntomas: Grant Ruts o escalofros. Mucosidad color marrn o roja en la nariz. Lquido amarillento o amarronado (Doctor, hospital de la Clinical cytogeneticist. Dolor en la cara, especialmente al inclinarse hacia adelante. Ganglios del cuello inflamados. Dolor al tragar. Zonas blancas en la parte de atrs de la garganta. Solicite ayuda de inmediato si: La falta de aire empeora. Los siguientes sntomas son muy intensos o constantes: Dolor de [place]. Dolor de odo. Dolor en la frente, detrs de los ojos y por encima de los pmulos (dolor sinusal). Dolor de pecho. Tiene una enfermedad pulmonar prolongada (crnica) junto con cualquiera de estos sntomas: Emitir sonidos de silbidos agudos al respirar, ms a menudo al exhalar (sibilancias). Tos prolongada (ms de [address]). Tos con sangre. Cambio en la mucosidad habitual. Tiene rigidez en el cuello. Tiene cambios en: La visin. La audicin. El razonamiento. El [place] de nimo. Estos sntomas pueden Customer service manager. Solicite ayuda de inmediato. Llame al 911. No espere a ver si los sntomas desaparecen. No conduzca por sus propios medios [employer] [facility]. Resumen Una infeccin de las vas respiratorias superiores (IVRS) es causada por un germen (virus). El tipo ms comn de IVRS suele conocerse como el resfro comn. Una IVRS suele mejorar en el transcurso de 7 a 10 das. Use los medicamentos de venta libre y los recetados solamente como se lo haya indicado el mdico. Esta informacin no tiene Theme park manager el consejo del mdico. Asegrese de hacerle al mdico cualquier pregunta que tenga. Document Revised: 03/21/2021 Document Reviewed: 03/21/2021 Elsevier Patient Education  2024 Elsevier Inc.    Sndrome de la articulacin temporomandibular Temporomandibular Joint Syndrome  El sndrome de la articulacin temporomandibular (sndrome de ATM)  es una afeccin que causa dolor en las articulaciones temporomandibulares. Estas articulaciones estn ubicadas cerca de las orejas y permiten abrir y Conservation officer, nature. Las Engineer, petroleum por el sndrome de ATM pueden tener dificultades o sentir dolor al Product manager, morder o Radio producer otros movimientos con la [place]. El sndrome de ATM suele ser leve y desaparece en unas pocas semanas. En ocasiones, sin embargo, la afeccin se Land en un problema a Air cabin crew (crnico). Cules son las causas? Esta afeccin puede ser causada por lo siguiente: Rechinar los dientes o apretar la [place]. Algunas personas lo hacen cuando estn estresadas. Artritis. Una lesin mandibular. Una lesin en la cabeza o el cuello. Piezas dentales o dentaduras postizas que no estn bien alineadas. En algunos casos, es posible que la causa de este sndrome no se conozca. Cules son los signos o los sntomas? El sntoma ms comn de esta afeccin es un dolor continuo en el lado de la cabeza, en la zona de la articulacin temporomandibular. Otros sntomas pueden incluir: Dolor al [employer], por ejemplo, al [employer] o morder. Imposibilidad de abrir [employer]. Producir un chasquido al abrir la boca. Dolor de [place]. Dolor de odos. Dolor en el cuello o el hombro. Cmo se diagnostica? Esta afeccin se puede diagnosticar en funcin de lo siguiente: Los sntomas y los antecedentes mdicos. Un examen fsico. El mdico puede revisar el  rango de movimiento de la [place]. Pruebas de diagnstico por imgenes, como radiografas o una resonancia magntica (RM). Tal vez deba consultar al dentista, quien revisar si las piezas dentales y la mandbula estn alineadas correctamente. Cmo se trata? El sndrome de ATM suele desaparecer solo. Si se necesita tratamiento, este puede incluir lo siguiente: Consumir alimentos blandos y Contractor hielo o calor. Medicamentos para Engineer, materials o la  inflamacin. Medicamentos o masajes para [employer]. Una frula dental, una placa de mordida o una boquilla para evitar que se rechinen los dientes o se aprieten las [place]. Tcnicas de relajacin o psicoterapia para ayudar a Museum/gallery exhibitions officer. Tratamiento para Chief Technology Officer en el que se aplica corriente Radio producer a los nervios a travs de la piel (estimulacin nerviosa elctrica transcutnea). Acupuntura. Esto puede ayudar a Engineer, materials. Ciruga de [place]. Esto debe realizarse en contadas ocasiones. Siga estas instrucciones en su casa:  Comida y bebida Consuma una dieta blanda si tiene dificultades para Product manager. No consuma los alimentos que Research scientist (physical sciences). No mastique goma de Theatre manager. Instrucciones generales Use los medicamentos de venta libre y los recetados solamente como se lo haya indicado el mdico. Si se lo indican, aplique hielo sobre la zona dolorida. Para hacer esto: Ponga el hielo en una bolsa plstica. Coloque una toalla entre la piel y Copy. Aplique el hielo durante 20 minutos, 2 o 3 veces por da. Retire el hielo si la piel se pone de color rojo brillante. Esto es [employer]. Si no puede sentir dolor, calor o fro, tiene un mayor riesgo de que se dae la zona. Aplique un pao mojado y tibio (compresa tibia) sobre la zona dolorida como se lo hayan indicado. Dese un masaje en la zona de la mandbula y haga los ejercicios de estiramiento como se lo haya indicado el mdico. Si le indicaron una frula dental, una placa de mordida o una boquilla, utilcela como se lo haya indicado el mdico. Concurra a todas las visitas de seguimiento. Esto es importante. Dnde buscar ms informacin [employer] of Retail buyer (Instituto Nacional de Investigacin Dental y Craneofacial): [email or web address] Comunquese con un mdico si: Tiene dificultad para comer. Tiene sntomas nuevos o sus sntomas empeoran. Solicite ayuda de inmediato  si: Se le traba la mandbula. Resumen El sndrome de la articulacin temporomandibular (sndrome de ATM) es una afeccin que causa dolor en las articulaciones temporomandibulares. Estas articulaciones estn ubicadas cerca de las orejas y permiten abrir y Conservation officer, nature. El sndrome de ATM suele ser leve y desaparece en unas pocas semanas. En ocasiones, sin embargo, la afeccin se Land en un problema a Air cabin crew (crnico). Los sntomas incluyen un dolor continuo en el lado de la cabeza, en la zona de la articulacin temporomandibular, dolor al [employer] o morder e incapacidad para abrir la mandbula totalmente. Tambin puede producir un chasquido al abrir la boca. El sndrome de ATM suele desaparecer solo. En caso de Network engineer, este puede incluir medicamentos para Engineer, materials y reducir la inflamacin o para Armed forces logistics/support/administrative officer. Tambin pueden utilizarse una frula dental, una placa de mordida o una boquilla para evitar que se rechinen los dientes o se aprieten las mandbulas. Esta informacin no tiene Theme park manager el consejo del mdico. Asegrese de hacerle al mdico cualquier pregunta que tenga. Document Revised: 04/06/2021 Document Reviewed: 04/06/2021 Elsevier Patient Education  2024 [employer].

## 2023-09-06 NOTE — Progress Notes (Signed)
Michele Vincent [phone]

## 2023-09-06 NOTE — Progress Notes (Signed)
Patient ID: Michele Vincent, female   DOB: [birth year], 53 y.o.   MRN: [id number]     Michele Vincent, is a 54 y.o. female  [id number]  [id number]  DOB - [birth year]  Chief Complaint  Patient presents with   Medical Management of Chronic Issues    Patient stated that she has been sneezing a lot since yesterday and been having headache. Patient stated that she also has been having pain in jaw area on left side.   Cough       Subjective:   Michele Vincent is a 53 y.o. female here today for Runny nose X 24 hrs and sneezing.  Also HA.  Coughing.  Burning in eyes.  This started yesterday.  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  She is unaware of any one else being sick.  She has started to feel worse today.    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   Was supposed to be here for regular check up.  Last vitamin D almost a year ago and was 10.  She did take weekly vitamin D for a while but completed it.  Depression and anxiety is stable but has definitely been more prevalent with menopause.    Needs RF on cymblata which she does feel has helped some.  She has not been sleeping well.  Trazadone 50 only helped a little.  Has passive thoughts of "wishing I wasn't here,"  but says she would never harm herself.  She is open to counseling and stress management techniques.       09/06/2023    1:52 PM 11/21/2022    2:32 PM 06/09/2021    4:22 PM  Depression screen PHQ 2/9  Decreased Interest 1 1 1   Down, Depressed, Hopeless 1 2 1   PHQ - 2 Score 2 3 2   Altered sleeping 3 3 3   Tired, decreased energy 1 1 1   Change in appetite  2   Feeling bad or failure about yourself  0 1   Trouble concentrating 1 1 1   Moving slowly or fidgety/restless 0 3 0  Suicidal thoughts 1 0 0  PHQ-9 Score 8 14 7   Difficult doing work/chores   Somewhat difficult     Also having pain in L jaw(not ear, not throat.)  she does admit to grinding her teeth at night. This is intermittent.     No  problems updated.  ALLERGIES: No Known Allergies  PAST MEDICAL HISTORY: Past Medical History:  Diagnosis Date   Anxiety    Cholelithiases    Colitis    Depression    Diverticulitis    Diverticulosis    Fatty liver    Gastritis    IBS (irritable bowel syndrome)     MEDICATIONS AT HOME: Prior to Admission medications   Medication Sig Start Date End Date Taking? Authorizing Provider  benzonatate (TESSALON) 100 MG capsule Take 2 capsules (200 mg total) by mouth 3 (three) times daily as needed. 09/06/23  Yes Georgian Co M, PA-C  chlorpheniramine (CHLOR-TRIMETON) 4 MG tablet Take 1 tab every 4 hours as needed for sneezing and runny nose. 09/06/23  Yes Georgian Co M, PA-C  dicyclomine (BENTYL) 10 MG capsule Take 1 capsule (10 mg total) by mouth 3 (three) times daily as needed (crampy abdominal pain). 11/21/22  Yes Claiborne Rigg, NP  Boris Lown Oil (OMEGA-3) 500 MG CAPS Take by mouth.   Yes [provider]  Magnesium 125 MG CAPS Take by mouth 2 (two)  times a week.   Yes [provider]  meloxicam (MOBIC) 15 MG tablet Take 1 tablet (15 mg total) by mouth daily. Prn jaw pain 09/06/23  Yes Desa Rech M, PA-C  Vitamin D, Ergocalciferol, (DRISDOL) 1.25 MG (50000 UNIT) CAPS capsule Take 1 capsule (50,000 Units total) by mouth every 7 (seven) days. 11/25/22  Yes Claiborne Rigg, NP  atorvastatin (LIPITOR) 20 MG tablet Take 1 tablet (20 mg total) by mouth daily. 09/06/23   Anders Simmonds, PA-C  DULoxetine (CYMBALTA) 60 MG capsule Take 1 capsule (60 mg total) by mouth daily. 09/06/23   Anders Simmonds, PA-C  traZODone (DESYREL) 50 MG tablet Take 1-2 tablets (50-100 mg total) by mouth at bedtime. 09/06/23   Anders Simmonds, PA-C    ROS: Neg cardiac Neg GI Neg GU Neg MS Neg neuro  Objective:   Vitals:   09/06/23 1343  BP: 118/79  Pulse: 100  SpO2: 100%  Weight: 179 lb 6.4 oz (81.4 kg)   Exam General appearance : Awake, alert, not in any distress. Speech  Clear. Not toxic looking HEENT: Atraumatic and Normocephalic, B TM congested without infection, throat with PND, nose is runny at times and she did not wear a mask.  Mask given.   Neck: Supple, no JVD. No cervical lymphadenopathy.  Chest: Good air entry bilaterally, CTAB.  No rales/rhonchi/wheezing CVS: S1 S2 regular, no murmurs.  Extremities: B/L Lower Ext shows no edema, both legs are warm to touch Neurology: Awake alert, and oriented X 3, CN II-XII intact, Non focal Skin: No Rash  Data Review Lab Results  Component Value Date   HGBA1C 5.5 11/21/2022   HGBA1C 5.6 06/09/2021   HGBA1C 5.4 04/19/2020    Assessment & Plan   1. Runny nose (Primary) URI-- benzonatate (TESSALON) 100 MG capsule; Take 2 capsules (200 mg total) by mouth 3 (three) times daily as needed.  Dispense: 40 capsule; Refill: 0 - chlorpheniramine (CHLOR-TRIMETON) 4 MG tablet; Take 1 tab every 4 hours as needed for sneezing and runny nose.  Dispense: 40 tablet; Refill: 0 - COVID-19, Flu A+B and RSV - POC Covid19/Flu A&B Antigen  2. Anxiety and depression Will have LCSW Coutney Hall reach out for counseling.  +PHQ9, #9=1 - DULoxetine (CYMBALTA) 60 MG capsule; Take 1 capsule (60 mg total) by mouth daily.  Dispense: 30 capsule; Refill: 5 - traZODone (DESYREL) 50 MG tablet; Take 1-2 tablets (50-100 mg total) by mouth at bedtime.  Dispense: 60 tablet; Refill: 2  3. Other insomnia - traZODone (DESYREL) 50 MG tablet; Take 1-2 tablets (50-100 mg total) by mouth at bedtime.  Dispense: 60 tablet; Refill: 2  4. Hyperlipidemia, unspecified hyperlipidemia type - atorvastatin (LIPITOR) 20 MG tablet; Take 1 tablet (20 mg total) by mouth daily.  Dispense: 90 tablet; Refill: 3 - Lipid Panel  5. Vitamin D deficiency disease - Vitamin D, 25-hydroxy  6. Bruxism/TMJ-advised biteblock - meloxicam (MOBIC) 15 MG tablet; Take 1 tablet (15 mg total) by mouth daily. Prn jaw pain  Dispense: 30 tablet; Refill: 0  7.  Noncompliance Discussed taking meds as directed  8. Language barrier [employer] "Clifton Custard" interpreters used and additional time performing visit was required.   9. Upper respiratory tract infection, unspecified type - benzonatate (TESSALON) 100 MG capsule; Take 2 capsules (200 mg total) by mouth 3 (three) times daily as needed.  Dispense: 40 capsule; Refill: 0 - chlorpheniramine (CHLOR-TRIMETON) 4 MG tablet; Take 1 tab every 4 hours as needed for sneezing and runny nose.  Dispense: 40 tablet; Refill: 0    Return in about 3 months (around 12/05/2023) for PCP for chronic conditions.  The patient was given clear instructions to go to ER or return to medical center if symptoms don't improve, worsen or new problems develop. The patient verbalized understanding. The patient was told to call to get lab results if they haven't heard anything in the next week.      Georgian Co, PA-C [facility] [place], [place] [phone]   09/06/2023, 2:38 PM

## 2023-09-07 ENCOUNTER — Other Ambulatory Visit: Payer: Self-pay | Admitting: Physician Assistant

## 2023-09-07 ENCOUNTER — Other Ambulatory Visit: Payer: Self-pay

## 2023-09-07 LAB — VITAMIN D 25 HYDROXY (VIT D DEFICIENCY, FRACTURES): Vit D, 25-Hydroxy: 19.1 ng/mL — ABNORMAL LOW (ref 30.0–100.0)

## 2023-09-07 LAB — LIPID PANEL
Chol/HDL Ratio: 2.2 {ratio} (ref 0.0–4.4)
Cholesterol, Total: 222 mg/dL — ABNORMAL HIGH (ref 100–199)
HDL: 101 mg/dL (ref >39)
LDL Chol Calc (NIH): 111 mg/dL — ABNORMAL HIGH (ref 0–99)
Triglycerides: 60 mg/dL (ref 0–149)
VLDL Cholesterol Cal: 10 mg/dL (ref 5–40)

## 2023-09-07 MED ORDER — VITAMIN D (ERGOCALCIFEROL) 1.25 MG (50000 UNIT) PO CAPS
50000.0000 [IU] | ORAL_CAPSULE | ORAL | 0 refills | Status: DC
  Filled 2023-09-07 – 2023-11-16 (×2): qty 12, 84d supply, fill #0

## 2023-09-10 ENCOUNTER — Telehealth: Payer: Self-pay

## 2023-09-10 NOTE — Telephone Encounter (Signed)
Pt was called and is aware of results, DOB was confirmed.   Interpreter id # [id number]

## 2023-09-10 NOTE — Telephone Encounter (Signed)
-----   Message from Georgian Co sent at 09/07/2023  9:59 AM EST ----- Please call patient and let them that their vitamin D is low.  This can contribute to muscle aches, anxiety, fatigue, and depression.  I have sent a prescription to the pharmacy for them to take once a week.  We will recheck this level in 3-4 months.  Cholesterol is high.  Continue cholesterol medications and eat heart healthy diet.  Thanks, Georgian Co, PA-C

## 2023-09-13 ENCOUNTER — Telehealth: Payer: Self-pay | Admitting: Licensed Clinical Social Worker

## 2023-09-13 ENCOUNTER — Other Ambulatory Visit: Payer: Self-pay

## 2023-09-13 NOTE — Telephone Encounter (Signed)
 LCSWA called patient today to introduce herself and to assess patients' mental health needs. Patient did not answer the phone her son did. Michele Vincent was able to speak with the pts son and ask him to ask the patient to return the call when she can . Patient was referred by PCP for anxiety.

## 2023-09-19 ENCOUNTER — Other Ambulatory Visit: Payer: Self-pay

## 2023-11-16 ENCOUNTER — Other Ambulatory Visit: Payer: Self-pay

## 2023-12-05 ENCOUNTER — Encounter: Payer: Self-pay | Admitting: Nurse Practitioner

## 2023-12-05 ENCOUNTER — Ambulatory Visit: Payer: Self-pay | Attending: Nurse Practitioner | Admitting: Nurse Practitioner

## 2023-12-05 ENCOUNTER — Other Ambulatory Visit: Payer: Self-pay

## 2023-12-05 VITALS — BP 126/82 | HR 97 | Resp 19 | Ht 61.0 in | Wt 173.4 lb

## 2023-12-05 DIAGNOSIS — E559 Vitamin D deficiency, unspecified: Secondary | ICD-10-CM

## 2023-12-05 DIAGNOSIS — F419 Anxiety disorder, unspecified: Secondary | ICD-10-CM

## 2023-12-05 DIAGNOSIS — F458 Other somatoform disorders: Secondary | ICD-10-CM

## 2023-12-05 DIAGNOSIS — Z1231 Encounter for screening mammogram for malignant neoplasm of breast: Secondary | ICD-10-CM

## 2023-12-05 DIAGNOSIS — G4709 Other insomnia: Secondary | ICD-10-CM

## 2023-12-05 DIAGNOSIS — F418 Other specified anxiety disorders: Secondary | ICD-10-CM

## 2023-12-05 MED ORDER — MELOXICAM 15 MG PO TABS
15.0000 mg | ORAL_TABLET | Freq: Every day | ORAL | 0 refills | Status: DC
  Filled 2023-12-05 – 2024-01-28 (×2): qty 30, 30d supply, fill #0

## 2023-12-05 MED ORDER — TRAZODONE HCL 150 MG PO TABS
150.0000 mg | ORAL_TABLET | Freq: Every day | ORAL | 1 refills | Status: DC
  Filled 2023-12-05 – 2024-01-28 (×2): qty 30, 30d supply, fill #0

## 2023-12-05 NOTE — Progress Notes (Signed)
 Assessment & Plan:  Michele Vincent was seen today for anxiety and depression.  Diagnoses and all orders for this visit:  Vitamin D  deficiency disease -     Cancel: VITAMIN D  25 Hydroxy (Vit-D Deficiency, Fractures)  Bruxism -     meloxicam  (MOBIC ) 15 MG tablet; Take 1 tablet (15 mg total) by mouth daily as needed for jaw pain.  Breast cancer screening by mammogram -     MS 3D SCR MAMMO BILAT BR (aka MM); Future  Anxiety and depression -     traZODone  (DESYREL ) 150 MG tablet; Take 1 tablet (150 mg total) by mouth at bedtime. For sleep  Other insomnia -     traZODone  (DESYREL ) 150 MG tablet; Take 1 tablet (150 mg total) by mouth at bedtime. For sleep    Patient has been counseled on age-appropriate routine health concerns for screening and prevention. These are reviewed and up-to-date. Referrals have been placed accordingly. Immunizations are up-to-date or declined.    Subjective:   Chief Complaint  Patient presents with   Anxiety   Depression    Michele Vincent 53 y.o. female presents to office today for follow up to anxiety and depression.   She has a past medical history of Anxiety, Cholelithiases, Colitis, Depression, Diverticulitis, Diverticulosis, Fatty liver, Gastritis, and IBS (irritable bowel syndrome).   She takes trazodone  for insomnia and cymbalta  for depression and anxiety. Denies any thoughts of self harm. Symptoms are well controlled.   Has a history of bruxism. Meloxicam  helps relieve her pain.    Review of Systems  Constitutional:  Negative for fever, malaise/fatigue and weight loss.  HENT:  Negative for nosebleeds.        SEE HPI  Eyes: Negative.  Negative for blurred vision, double vision and photophobia.  Respiratory: Negative.  Negative for cough and shortness of breath.   Cardiovascular: Negative.  Negative for chest pain, palpitations and leg swelling.  Gastrointestinal: Negative.  Negative for heartburn, nausea and vomiting.   Musculoskeletal: Negative.  Negative for myalgias.  Neurological: Negative.  Negative for dizziness, focal weakness, seizures and headaches.  Psychiatric/Behavioral:  Positive for depression. Negative for suicidal ideas. The patient is nervous/anxious and has insomnia.     Past Medical History:  Diagnosis Date   Anxiety    Cholelithiases    Colitis    Depression    Diverticulitis    Diverticulosis    Fatty liver    Gastritis    IBS (irritable bowel syndrome)     Past Surgical History:  Procedure Laterality Date   COLONOSCOPY     NO PAST SURGERIES      Family History  Problem Relation Age of Onset   CVA Mother    Hypertension Mother    Pneumonia Father    Hypertension Sister    Hypertension Sister    Colon cancer Neg Hx     Social History Reviewed with no changes to be made today.   Outpatient Medications Prior to Visit  Medication Sig Dispense Refill   atorvastatin  (LIPITOR) 20 MG tablet Take 1 tablet (20 mg total) by mouth daily. 90 tablet 3   DULoxetine  (CYMBALTA ) 60 MG capsule Take 1 capsule (60 mg total) by mouth daily. 30 capsule 5   Krill Oil (OMEGA-3) 500 MG CAPS Take by mouth.     Magnesium  125 MG CAPS Take by mouth 2 (two) times a week.     traZODone  (DESYREL ) 50 MG tablet Take 1-2 tablets (50-100 mg total) by mouth  at bedtime. 60 tablet 2   dicyclomine  (BENTYL ) 10 MG capsule Take 1 capsule (10 mg total) by mouth 3 (three) times daily as needed (crampy abdominal pain). (Patient not taking: Reported on 52/30/2025) 90 capsule 1   Vitamin D , Ergocalciferol , (DRISDOL ) 1.25 MG (50000 UNIT) CAPS capsule Take 1 capsule (50,000 Units total) by mouth every 7 (seven) days. (Patient not taking: Reported on 52/30/2025) 12 capsule 0   benzonatate  (TESSALON ) 100 MG capsule Take 2 capsules (200 mg total) by mouth 3 (three) times daily as needed. (Patient not taking: Reported on 52/30/2025) 40 capsule 0   chlorpheniramine  (CHLOR-TRIMETON ) 4 MG tablet Take 1 tab every 4 hours as  needed for sneezing and runny nose. (Patient not taking: Reported on 52/30/2025) 40 tablet 0   meloxicam  (MOBIC ) 15 MG tablet Take 1 tablet (15 mg total) by mouth daily as needed for jaw pain. (Patient not taking: Reported on 52/30/2025) 30 tablet 0   No facility-administered medications prior to visit.    No Known Allergies     Objective:    BP 126/82 (BP Location: Left Arm, Patient Position: Sitting, Cuff Size: Normal)   Pulse 97   Resp 19   Ht 5\' 1"  (1.549 m)   Wt 173 lb 6.4 oz (78.7 kg)   LMP 11/15/2023 (Approximate)   SpO2 99%   BMI 32.76 kg/m  Wt Readings from Last 3 Encounters:  12/05/23 173 lb 6.4 oz (78.7 kg)  09/06/23 179 lb 6.4 oz (81.4 kg)  11/21/22 170 lb (77.1 kg)    Physical Exam Vitals and nursing note reviewed.  Constitutional:      Appearance: She is well-developed.  HENT:     Head: Normocephalic and atraumatic.  Cardiovascular:     Rate and Rhythm: Normal rate and regular rhythm.     Heart sounds: Normal heart sounds. No murmur heard.    No friction rub. No gallop.  Pulmonary:     Effort: Pulmonary effort is normal. No tachypnea or respiratory distress.     Breath sounds: Normal breath sounds. No decreased breath sounds, wheezing, rhonchi or rales.  Chest:     Chest wall: No tenderness.  Abdominal:     General: Bowel sounds are normal.     Palpations: Abdomen is soft.  Musculoskeletal:        General: Normal range of motion.     Cervical back: Normal range of motion.  Skin:    General: Skin is warm and dry.  Neurological:     Mental Status: She is alert and oriented to person, place, and time.     Coordination: Coordination normal.  Psychiatric:        Behavior: Behavior normal. Behavior is cooperative.        Thought Content: Thought content normal.        Judgment: Judgment normal.          Patient has been counseled extensively about nutrition and exercise as well as the importance of adherence with medications and regular follow-up.  The patient was given clear instructions to go to ER or return to medical center if symptoms don't improve, worsen or new problems develop. The patient verbalized understanding.   Follow-up: Return in about 4 months (around 04/05/2024) for physical.   Collins Dean, FNP-BC [facility] and [facility] [place], [place] [phone]   12/06/2023, 11:16 PM

## 2023-12-05 NOTE — Patient Instructions (Signed)
 AMAZON: mouth guard for tmj relief women

## 2023-12-06 ENCOUNTER — Encounter: Payer: Self-pay | Admitting: Nurse Practitioner

## 2023-12-06 MED ORDER — VITAMIN D (ERGOCALCIFEROL) 1.25 MG (50000 UNIT) PO CAPS
50000.0000 [IU] | ORAL_CAPSULE | ORAL | 0 refills | Status: AC
  Filled 2023-12-06 – 2024-01-28 (×2): qty 12, 84d supply, fill #0

## 2023-12-07 ENCOUNTER — Other Ambulatory Visit: Payer: Self-pay

## 2023-12-17 ENCOUNTER — Other Ambulatory Visit: Payer: Self-pay

## 2024-01-28 ENCOUNTER — Other Ambulatory Visit: Payer: Self-pay | Admitting: Nurse Practitioner

## 2024-01-28 ENCOUNTER — Other Ambulatory Visit: Payer: Self-pay

## 2024-01-28 DIAGNOSIS — K582 Mixed irritable bowel syndrome: Secondary | ICD-10-CM

## 2024-01-28 NOTE — Telephone Encounter (Unsigned)
 Copied from CRM [id number]. Topic: Clinical - Medication Refill >> Jan 28, 2024  2:50 PM Carlatta H wrote: Medication:  atorvastatin  (LIPITOR) 20 MG tablet dicyclomine  (BENTYL ) 10 MG capsule traZODone  (DESYREL ) 150 MG tablet DULoxetine  (CYMBALTA ) 60 MG capsule      Has the patient contacted their pharmacy? No (Agent: If no, request that the patient contact the pharmacy for the refill. If patient does not wish to contact the pharmacy document the reason why and proceed with request.) (Agent: If yes, when and what did the pharmacy advise?)  This is the patient's preferred pharmacy:  [facility] [address], [address] [place] [address] Phone: [phone] Fax: [phone]  Is this the correct pharmacy for this prescription? Yes If no, delete pharmacy and type the correct one.   Has the prescription been filled recently? No  Is the patient out of the medication? Yes  Has the patient been seen for an appointment in the last year OR does the patient have an upcoming appointment? Yes  Can we respond through MyChart? No  Agent: Please be advised that Rx refills may take up to 3 business days. We ask that you follow-up with your pharmacy.

## 2024-01-30 ENCOUNTER — Other Ambulatory Visit: Payer: Self-pay

## 2024-01-30 MED ORDER — DICYCLOMINE HCL 10 MG PO CAPS
10.0000 mg | ORAL_CAPSULE | Freq: Three times a day (TID) | ORAL | 0 refills | Status: DC | PRN
  Filled 2024-01-30: qty 90, 30d supply, fill #0

## 2024-01-30 NOTE — Telephone Encounter (Signed)
 Requested Prescriptions  Pending Prescriptions Disp Refills   dicyclomine  (BENTYL ) 10 MG capsule 270 capsule 0    Sig: Take 1 capsule (10 mg total) by mouth 3 (three) times daily as needed (crampy abdominal pain).     Gastroenterology:  Antispasmodic Agents Passed - 01/30/2024 10:40 AM      Passed - Valid encounter within last 12 months    Recent Outpatient Visits           1 month ago Anxiety and depression   [facility] [place]. [facility] Theotis Haze ORN, NP   4 months ago Runny nose   [facility] [place]. [facility] [place], [place], [place]   1 year ago Anxiety and depression   [facility] [place]. [facility] Theotis Haze ORN, NP   2 years ago Screening cholesterol level   [facility] [place]. [facility] [place], [place], [place]   3 years ago Encounter for Papanicolaou smear for cervical cancer screening   [facility] Of [place]. [facility] Theotis Haze ORN, NP       Future Appointments             In 2 months Theotis Haze ORN, NP [facility] - [facility] Dept Of [place]. [facility]

## 2024-02-12 ENCOUNTER — Other Ambulatory Visit: Payer: Self-pay

## 2024-04-01 ENCOUNTER — Encounter: Payer: Self-pay | Admitting: Nurse Practitioner

## 2024-04-01 ENCOUNTER — Ambulatory Visit: Payer: Self-pay | Attending: Nurse Practitioner | Admitting: Nurse Practitioner

## 2024-04-01 ENCOUNTER — Other Ambulatory Visit: Payer: Self-pay

## 2024-04-01 VITALS — BP 158/92 | HR 76 | Resp 19 | Ht 61.0 in | Wt 178.0 lb

## 2024-04-01 DIAGNOSIS — G4709 Other insomnia: Secondary | ICD-10-CM

## 2024-04-01 DIAGNOSIS — F32A Depression, unspecified: Secondary | ICD-10-CM

## 2024-04-01 DIAGNOSIS — F458 Other somatoform disorders: Secondary | ICD-10-CM

## 2024-04-01 DIAGNOSIS — E785 Hyperlipidemia, unspecified: Secondary | ICD-10-CM

## 2024-04-01 DIAGNOSIS — R6884 Jaw pain: Secondary | ICD-10-CM

## 2024-04-01 DIAGNOSIS — K582 Mixed irritable bowel syndrome: Secondary | ICD-10-CM

## 2024-04-01 DIAGNOSIS — Z0000 Encounter for general adult medical examination without abnormal findings: Secondary | ICD-10-CM

## 2024-04-01 DIAGNOSIS — G8929 Other chronic pain: Secondary | ICD-10-CM

## 2024-04-01 DIAGNOSIS — F419 Anxiety disorder, unspecified: Secondary | ICD-10-CM

## 2024-04-01 MED ORDER — DULOXETINE HCL 60 MG PO CPEP
60.0000 mg | ORAL_CAPSULE | Freq: Every day | ORAL | 1 refills | Status: AC
Start: 2024-04-01 — End: ?
  Filled 2024-04-01: qty 30, 30d supply, fill #0
  Filled 2024-05-23: qty 30, 30d supply, fill #1
  Filled 2024-07-28: qty 30, 30d supply, fill #2

## 2024-04-01 MED ORDER — TRAZODONE HCL 150 MG PO TABS
150.0000 mg | ORAL_TABLET | Freq: Every day | ORAL | 1 refills | Status: AC
  Filled 2024-04-01: qty 30, 30d supply, fill #0
  Filled 2024-05-23: qty 30, 30d supply, fill #1
  Filled 2024-07-28: qty 30, 30d supply, fill #2

## 2024-04-01 MED ORDER — ATORVASTATIN CALCIUM 20 MG PO TABS
20.0000 mg | ORAL_TABLET | Freq: Every day | ORAL | 3 refills | Status: AC
  Filled 2024-04-01 – 2024-05-23 (×2): qty 90, 90d supply, fill #0

## 2024-04-01 MED ORDER — IBUPROFEN 800 MG PO TABS
800.0000 mg | ORAL_TABLET | Freq: Three times a day (TID) | ORAL | 0 refills | Status: AC | PRN
  Filled 2024-04-01: qty 60, 20d supply, fill #0

## 2024-04-01 NOTE — Progress Notes (Unsigned)
 Assessment & Plan:  Michele Vincent was seen today for annual exam.  Diagnoses and all orders for this visit:  Encounter for annual physical exam -     Lipid panel -     VITAMIN D  25 Hydroxy (Vit-D Deficiency, Fractures) -     CMP14+EGFR; Future -     CMP14+EGFR  Hyperlipidemia, unspecified hyperlipidemia type -     atorvastatin  (LIPITOR) 20 MG tablet; Take 1 tablet (20 mg total) by mouth daily.  Anxiety and depression -     DULoxetine  (CYMBALTA ) 60 MG capsule; Take 1 capsule (60 mg total) by mouth daily. -     traZODone  (DESYREL ) 150 MG tablet; Take 1 tablet (150 mg total) by mouth at bedtime. For sleep  Other insomnia -     traZODone  (DESYREL ) 150 MG tablet; Take 1 tablet (150 mg total) by mouth at bedtime. For sleep  Chronic jaw pain -     ibuprofen  (ADVIL ) 800 MG tablet; Take 1 tablet (800 mg total) by mouth every 8 (eight) hours as needed. For jaw pain    Patient has been counseled on age-appropriate routine health concerns for screening and prevention. These are reviewed and up-to-date. Referrals have been placed accordingly. Immunizations are up-to-date or declined.    Subjective:   Chief Complaint  Patient presents with   Annual Exam    Michele Vincent is here today for an annual physical exam.   VRI was used to communicate directly with patient for the entire encounter including providing detailed patient instructions.    She has a past medical history of Anxiety, Cholelithiases, Colitis, Depression, Diverticulitis, Diverticulosis, Fatty liver, Gastritis, and IBS (irritable bowel syndrome).    She takes trazodone  for insomnia and cymbalta  for depression and anxiety. Denies any thoughts of self harm. Symptoms are well controlled.    Has a history of chronic left jaw pain. Meloxicam  io longer effective. Pain is intermittent and mild.     Review of Systems  Constitutional:  Negative for fever, malaise/fatigue and weight loss.  HENT:  Negative for nosebleeds.        SEE HPI   Eyes: Negative.  Negative for blurred vision, double vision and photophobia.  Respiratory: Negative.  Negative for cough and shortness of breath.   Cardiovascular: Negative.  Negative for chest pain, palpitations and leg swelling.  Gastrointestinal: Negative.  Negative for heartburn, nausea and vomiting.  Genitourinary: Negative.   Musculoskeletal: Negative.  Negative for myalgias.  Skin: Negative.   Neurological: Negative.  Negative for dizziness, focal weakness, seizures and headaches.  Endo/Heme/Allergies: Negative.   Psychiatric/Behavioral: Negative.  Negative for suicidal ideas.     Past Medical History:  Diagnosis Date   Anxiety    Cholelithiases    Colitis    Depression    Diverticulitis    Diverticulosis    Fatty liver    Gastritis    IBS (irritable bowel syndrome)     Past Surgical History:  Procedure Laterality Date   COLONOSCOPY     NO PAST SURGERIES      Family History  Problem Relation Age of Onset   CVA Mother    Hypertension Mother    Pneumonia Father    Hypertension Sister    Hypertension Sister    Colon cancer Neg Hx     Social History Reviewed with no changes to be made today.   Outpatient Medications Prior to Visit  Medication Sig Dispense Refill   Krill Oil (OMEGA-3) 500 MG CAPS Take by mouth.  Magnesium  125 MG CAPS Take by mouth 2 (two) times a week.     Vitamin D , Ergocalciferol , (DRISDOL ) 1.25 MG (50000 UNIT) CAPS capsule Take 1 capsule (50,000 Units total) by mouth every 7 (seven) days. 12 capsule 0   atorvastatin  (LIPITOR) 20 MG tablet Take 1 tablet (20 mg total) by mouth daily. 90 tablet 3   dicyclomine  (BENTYL ) 10 MG capsule Take 1 capsule (10 mg total) by mouth 3 (three) times daily as needed (crampy abdominal pain). 270 capsule 0   DULoxetine  (CYMBALTA ) 60 MG capsule Take 1 capsule (60 mg total) by mouth daily. 30 capsule 5   meloxicam  (MOBIC ) 15 MG tablet Take 1 tablet (15 mg total) by mouth daily as needed for jaw pain. 30 tablet  0   traZODone  (DESYREL ) 150 MG tablet Take 1 tablet (150 mg total) by mouth at bedtime. For sleep 90 tablet 1   No facility-administered medications prior to visit.    No Known Allergies     Objective:    BP (!) 158/92 (BP Location: Right Arm, Patient Position: Sitting, Cuff Size: Normal)   Pulse 76   Resp 19   Ht 5' 1 (1.549 m)   Wt 178 lb (80.7 kg)   SpO2 99%   BMI 33.63 kg/m  Wt Readings from Last 3 Encounters:  04/01/24 178 lb (80.7 kg)  12/05/23 173 lb 6.4 oz (78.7 kg)  09/06/23 179 lb 6.4 oz (81.4 kg)    Physical Exam Constitutional:      Appearance: She is well-developed.  HENT:     Head: Normocephalic and atraumatic.     Right Ear: Hearing, tympanic membrane, ear canal and external ear normal.     Left Ear: Hearing, tympanic membrane, ear canal and external ear normal.     Nose: Nose normal.     Right Turbinates: Not enlarged.     Left Turbinates: Not enlarged.     Mouth/Throat:     Lips: Pink.     Mouth: Mucous membranes are moist.     Dentition: No dental tenderness, gingival swelling, dental abscesses or gum lesions.     Pharynx: No oropharyngeal exudate.  Eyes:     General: No scleral icterus.       Right eye: No discharge.     Extraocular Movements: Extraocular movements intact.     Conjunctiva/sclera: Conjunctivae normal.     Pupils: Pupils are equal, round, and reactive to light.  Neck:     Thyroid : No thyroid  mass or thyromegaly.     Trachea: No tracheal deviation.  Cardiovascular:     Rate and Rhythm: Normal rate and regular rhythm.     Heart sounds: Normal heart sounds. No murmur heard.    No friction rub.  Pulmonary:     Effort: Pulmonary effort is normal. No accessory muscle usage or respiratory distress.     Breath sounds: Normal breath sounds. No decreased breath sounds, wheezing, rhonchi or rales.  Abdominal:     General: Bowel sounds are normal. There is no distension.     Palpations: Abdomen is soft. There is no mass.      Tenderness: There is no abdominal tenderness. There is no right CVA tenderness, left CVA tenderness, guarding or rebound.     Hernia: No hernia is present.  Musculoskeletal:        General: No tenderness or deformity. Normal range of motion.     Cervical back: Normal range of motion and neck supple.  Lymphadenopathy:  Cervical: No cervical adenopathy.     Right cervical: No superficial, deep or posterior cervical adenopathy.    Left cervical: No superficial, deep or posterior cervical adenopathy.  Skin:    General: Skin is warm and dry.     Findings: No erythema.  Neurological:     Mental Status: She is alert and oriented to person, place, and time.     Cranial Nerves: No cranial nerve deficit.     Motor: Motor function is intact.     Coordination: Coordination is intact. Coordination normal.     Gait: Gait is intact.     Deep Tendon Reflexes:     Reflex Scores:      Patellar reflexes are 1+ on the right side and 1+ on the left side. Psychiatric:        Attention and Perception: Attention normal.        Mood and Affect: Mood normal.        Speech: Speech normal.        Behavior: Behavior normal.        Thought Content: Thought content normal.        Judgment: Judgment normal.          Patient has been counseled extensively about nutrition and exercise as well as the importance of adherence with medications and regular follow-up. The patient was given clear instructions to go to ER or return to medical center if symptoms don't improve, worsen or new problems develop. The patient verbalized understanding.   Follow-up: Return in about 6 months (around 10/02/2024).   Haze LELON Servant, FNP-BC [facility] [place], [place] [phone]   04/02/2024, 8:51 AM

## 2024-04-02 ENCOUNTER — Encounter: Payer: Self-pay | Admitting: Nurse Practitioner

## 2024-04-02 ENCOUNTER — Ambulatory Visit: Payer: Self-pay | Admitting: Nurse Practitioner

## 2024-04-02 LAB — CMP14+EGFR
ALT: 33 IU/L — ABNORMAL HIGH (ref 0–32)
AST: 23 IU/L (ref 0–40)
Albumin: 4.7 g/dL (ref 3.8–4.9)
Alkaline Phosphatase: 112 IU/L (ref 44–121)
BUN/Creatinine Ratio: 23 (ref 9–23)
BUN: 15 mg/dL (ref 6–24)
Bilirubin Total: 0.3 mg/dL (ref 0.0–1.2)
CO2: 20 mmol/L (ref 20–29)
Calcium: 9.9 mg/dL (ref 8.7–10.2)
Chloride: 102 mmol/L (ref 96–106)
Creatinine, Ser: 0.65 mg/dL (ref 0.57–1.00)
Globulin, Total: 2.7 g/dL (ref 1.5–4.5)
Glucose: 97 mg/dL (ref 70–99)
Potassium: 4.5 mmol/L (ref 3.5–5.2)
Sodium: 140 mmol/L (ref 134–144)
Total Protein: 7.4 g/dL (ref 6.0–8.5)
eGFR: 107 mL/min/1.73 (ref >59)

## 2024-04-02 LAB — VITAMIN D 25 HYDROXY (VIT D DEFICIENCY, FRACTURES): Vit D, 25-Hydroxy: 34.2 ng/mL (ref 30.0–100.0)

## 2024-04-02 LAB — LIPID PANEL
Chol/HDL Ratio: 2.7 ratio (ref 0.0–4.4)
Cholesterol, Total: 237 mg/dL — ABNORMAL HIGH (ref 100–199)
HDL: 88 mg/dL (ref >39)
LDL Chol Calc (NIH): 132 mg/dL — ABNORMAL HIGH (ref 0–99)
Triglycerides: 99 mg/dL (ref 0–149)
VLDL Cholesterol Cal: 17 mg/dL (ref 5–40)

## 2024-05-23 ENCOUNTER — Other Ambulatory Visit (HOSPITAL_COMMUNITY): Payer: Self-pay

## 2024-05-23 ENCOUNTER — Other Ambulatory Visit: Payer: Self-pay

## 2024-07-28 ENCOUNTER — Other Ambulatory Visit: Payer: Self-pay
